# Patient Record
Sex: Male | Born: 1965 | Hispanic: No | Marital: Married | Smoking: Never smoker
Health system: Southern US, Community
[De-identification: ages and names within clinical notes are randomized; demographics above are authoritative.]

## PROBLEM LIST (undated history)

## (undated) ENCOUNTER — Emergency Department (HOSPITAL_COMMUNITY): Admission: EM | Payer: No Typology Code available for payment source | Source: Home / Self Care

---

## 1998-10-27 ENCOUNTER — Emergency Department (HOSPITAL_COMMUNITY): Admission: EM | Admit: 1998-10-27 | Discharge: 1998-10-27 | Payer: Self-pay | Admitting: Emergency Medicine

## 2000-08-19 ENCOUNTER — Emergency Department (HOSPITAL_COMMUNITY): Admission: EM | Admit: 2000-08-19 | Discharge: 2000-08-19 | Payer: Self-pay | Admitting: Emergency Medicine

## 2000-08-20 ENCOUNTER — Inpatient Hospital Stay (HOSPITAL_COMMUNITY): Admission: EM | Admit: 2000-08-20 | Discharge: 2000-08-24 | Payer: Self-pay | Admitting: *Deleted

## 2000-08-20 ENCOUNTER — Encounter: Payer: Self-pay | Admitting: Internal Medicine

## 2000-08-25 ENCOUNTER — Encounter: Admission: RE | Admit: 2000-08-25 | Discharge: 2000-11-23 | Payer: Self-pay | Admitting: Internal Medicine

## 2002-10-04 ENCOUNTER — Emergency Department (HOSPITAL_COMMUNITY): Admission: EM | Admit: 2002-10-04 | Discharge: 2002-10-05 | Payer: Self-pay | Admitting: Emergency Medicine

## 2002-10-05 ENCOUNTER — Encounter: Payer: Self-pay | Admitting: Emergency Medicine

## 2002-10-15 ENCOUNTER — Emergency Department (HOSPITAL_COMMUNITY): Admission: EM | Admit: 2002-10-15 | Discharge: 2002-10-15 | Payer: Self-pay | Admitting: Emergency Medicine

## 2002-11-10 ENCOUNTER — Encounter: Payer: Self-pay | Admitting: Family Medicine

## 2002-11-10 ENCOUNTER — Ambulatory Visit (HOSPITAL_COMMUNITY): Admission: RE | Admit: 2002-11-10 | Discharge: 2002-11-10 | Payer: Self-pay | Admitting: Family Medicine

## 2002-11-13 ENCOUNTER — Encounter: Payer: Self-pay | Admitting: Family Medicine

## 2002-11-13 ENCOUNTER — Ambulatory Visit (HOSPITAL_COMMUNITY): Admission: RE | Admit: 2002-11-13 | Discharge: 2002-11-13 | Payer: Self-pay | Admitting: Family Medicine

## 2002-11-16 ENCOUNTER — Emergency Department (HOSPITAL_COMMUNITY): Admission: EM | Admit: 2002-11-16 | Discharge: 2002-11-17 | Payer: Self-pay | Admitting: Emergency Medicine

## 2002-11-16 ENCOUNTER — Encounter: Payer: Self-pay | Admitting: Emergency Medicine

## 2003-10-22 ENCOUNTER — Encounter: Admission: RE | Admit: 2003-10-22 | Discharge: 2003-10-22 | Payer: Self-pay | Admitting: Sports Medicine

## 2003-10-27 ENCOUNTER — Ambulatory Visit (HOSPITAL_COMMUNITY): Admission: RE | Admit: 2003-10-27 | Discharge: 2003-10-27 | Payer: Self-pay | Admitting: Sports Medicine

## 2004-01-02 ENCOUNTER — Emergency Department (HOSPITAL_COMMUNITY): Admission: EM | Admit: 2004-01-02 | Discharge: 2004-01-02 | Payer: Self-pay | Admitting: *Deleted

## 2004-01-12 ENCOUNTER — Emergency Department (HOSPITAL_COMMUNITY): Admission: EM | Admit: 2004-01-12 | Discharge: 2004-01-12 | Payer: Self-pay | Admitting: Emergency Medicine

## 2004-02-04 ENCOUNTER — Ambulatory Visit (HOSPITAL_COMMUNITY): Admission: RE | Admit: 2004-02-04 | Discharge: 2004-02-04 | Payer: Self-pay | Admitting: Chiropractic Medicine

## 2004-09-27 ENCOUNTER — Emergency Department (HOSPITAL_COMMUNITY): Admission: EM | Admit: 2004-09-27 | Discharge: 2004-09-27 | Payer: Self-pay | Admitting: Emergency Medicine

## 2004-10-19 ENCOUNTER — Encounter: Admission: RE | Admit: 2004-10-19 | Discharge: 2004-10-19 | Payer: Self-pay | Admitting: Interventional Cardiology

## 2004-10-27 ENCOUNTER — Ambulatory Visit: Payer: Self-pay | Admitting: *Deleted

## 2004-12-01 ENCOUNTER — Ambulatory Visit: Payer: Self-pay | Admitting: Family Medicine

## 2007-06-20 ENCOUNTER — Ambulatory Visit (HOSPITAL_COMMUNITY): Admission: RE | Admit: 2007-06-20 | Discharge: 2007-06-20 | Payer: Self-pay | Admitting: Oncology

## 2007-07-15 ENCOUNTER — Ambulatory Visit (HOSPITAL_COMMUNITY): Admission: RE | Admit: 2007-07-15 | Discharge: 2007-07-15 | Payer: Self-pay | Admitting: Chiropractic Medicine

## 2007-08-14 ENCOUNTER — Emergency Department (HOSPITAL_COMMUNITY): Admission: EM | Admit: 2007-08-14 | Discharge: 2007-08-14 | Payer: Self-pay | Admitting: Emergency Medicine

## 2007-08-19 ENCOUNTER — Emergency Department (HOSPITAL_COMMUNITY): Admission: EM | Admit: 2007-08-19 | Discharge: 2007-08-19 | Payer: Self-pay | Admitting: Emergency Medicine

## 2007-08-23 ENCOUNTER — Emergency Department (HOSPITAL_COMMUNITY): Admission: EM | Admit: 2007-08-23 | Discharge: 2007-08-23 | Payer: Self-pay | Admitting: Emergency Medicine

## 2007-10-21 ENCOUNTER — Emergency Department (HOSPITAL_COMMUNITY): Admission: EM | Admit: 2007-10-21 | Discharge: 2007-10-21 | Payer: Self-pay | Admitting: Emergency Medicine

## 2007-12-24 ENCOUNTER — Emergency Department (HOSPITAL_COMMUNITY): Admission: EM | Admit: 2007-12-24 | Discharge: 2007-12-24 | Payer: Self-pay | Admitting: Emergency Medicine

## 2008-01-20 ENCOUNTER — Emergency Department (HOSPITAL_COMMUNITY): Admission: EM | Admit: 2008-01-20 | Discharge: 2008-01-20 | Payer: Self-pay | Admitting: Emergency Medicine

## 2008-10-26 ENCOUNTER — Emergency Department (HOSPITAL_COMMUNITY): Admission: EM | Admit: 2008-10-26 | Discharge: 2008-10-26 | Payer: Self-pay | Admitting: Emergency Medicine

## 2008-10-27 ENCOUNTER — Emergency Department (HOSPITAL_COMMUNITY): Admission: EM | Admit: 2008-10-27 | Discharge: 2008-10-27 | Payer: Self-pay | Admitting: Emergency Medicine

## 2008-12-14 ENCOUNTER — Emergency Department (HOSPITAL_COMMUNITY): Admission: EM | Admit: 2008-12-14 | Discharge: 2008-12-14 | Payer: Self-pay | Admitting: Emergency Medicine

## 2008-12-17 ENCOUNTER — Emergency Department (HOSPITAL_COMMUNITY): Admission: EM | Admit: 2008-12-17 | Discharge: 2008-12-17 | Payer: Self-pay | Admitting: Emergency Medicine

## 2008-12-20 ENCOUNTER — Emergency Department (HOSPITAL_COMMUNITY): Admission: EM | Admit: 2008-12-20 | Discharge: 2008-12-20 | Payer: Self-pay | Admitting: Family Medicine

## 2008-12-21 ENCOUNTER — Emergency Department (HOSPITAL_COMMUNITY): Admission: EM | Admit: 2008-12-21 | Discharge: 2008-12-22 | Payer: Self-pay | Admitting: Emergency Medicine

## 2009-01-17 ENCOUNTER — Emergency Department (HOSPITAL_COMMUNITY): Admission: EM | Admit: 2009-01-17 | Discharge: 2009-01-17 | Payer: Self-pay | Admitting: Emergency Medicine

## 2009-01-18 ENCOUNTER — Emergency Department (HOSPITAL_COMMUNITY): Admission: EM | Admit: 2009-01-18 | Discharge: 2009-01-18 | Payer: Self-pay | Admitting: Emergency Medicine

## 2009-02-10 ENCOUNTER — Emergency Department (HOSPITAL_COMMUNITY): Admission: EM | Admit: 2009-02-10 | Discharge: 2009-02-10 | Payer: Self-pay | Admitting: Family Medicine

## 2009-04-10 ENCOUNTER — Emergency Department (HOSPITAL_COMMUNITY): Admission: EM | Admit: 2009-04-10 | Discharge: 2009-04-11 | Payer: Self-pay | Admitting: Emergency Medicine

## 2009-04-23 ENCOUNTER — Emergency Department (HOSPITAL_COMMUNITY): Admission: EM | Admit: 2009-04-23 | Discharge: 2009-04-23 | Payer: Self-pay | Admitting: Emergency Medicine

## 2009-06-26 ENCOUNTER — Emergency Department (HOSPITAL_COMMUNITY): Admission: EM | Admit: 2009-06-26 | Discharge: 2009-06-26 | Payer: Self-pay | Admitting: Internal Medicine

## 2009-08-09 ENCOUNTER — Emergency Department (HOSPITAL_COMMUNITY): Admission: EM | Admit: 2009-08-09 | Discharge: 2009-08-09 | Payer: Self-pay | Admitting: Emergency Medicine

## 2009-09-03 ENCOUNTER — Emergency Department (HOSPITAL_COMMUNITY): Admission: EM | Admit: 2009-09-03 | Discharge: 2009-09-04 | Payer: Self-pay | Admitting: Emergency Medicine

## 2009-09-04 ENCOUNTER — Emergency Department (HOSPITAL_COMMUNITY): Admission: EM | Admit: 2009-09-04 | Discharge: 2009-09-04 | Payer: Self-pay | Admitting: Emergency Medicine

## 2009-09-05 ENCOUNTER — Emergency Department (HOSPITAL_COMMUNITY): Admission: EM | Admit: 2009-09-05 | Discharge: 2009-09-05 | Payer: Self-pay | Admitting: Emergency Medicine

## 2009-10-12 ENCOUNTER — Emergency Department (HOSPITAL_COMMUNITY): Admission: EM | Admit: 2009-10-12 | Discharge: 2009-10-12 | Payer: Self-pay | Admitting: Emergency Medicine

## 2009-11-19 ENCOUNTER — Emergency Department (HOSPITAL_COMMUNITY): Admission: EM | Admit: 2009-11-19 | Discharge: 2009-11-19 | Payer: Self-pay | Admitting: Emergency Medicine

## 2010-03-13 ENCOUNTER — Emergency Department (HOSPITAL_COMMUNITY): Admission: EM | Admit: 2010-03-13 | Discharge: 2010-03-13 | Payer: Self-pay | Admitting: Emergency Medicine

## 2010-03-18 ENCOUNTER — Emergency Department (HOSPITAL_COMMUNITY): Admission: EM | Admit: 2010-03-18 | Discharge: 2010-03-18 | Payer: Self-pay | Admitting: Emergency Medicine

## 2010-04-15 ENCOUNTER — Emergency Department (HOSPITAL_COMMUNITY)
Admission: EM | Admit: 2010-04-15 | Discharge: 2010-04-15 | Payer: Self-pay | Source: Home / Self Care | Admitting: Emergency Medicine

## 2010-04-16 ENCOUNTER — Emergency Department (HOSPITAL_COMMUNITY)
Admission: EM | Admit: 2010-04-16 | Discharge: 2010-04-16 | Payer: Self-pay | Source: Home / Self Care | Admitting: Emergency Medicine

## 2010-04-19 ENCOUNTER — Emergency Department (HOSPITAL_COMMUNITY)
Admission: EM | Admit: 2010-04-19 | Discharge: 2010-04-19 | Payer: Self-pay | Source: Home / Self Care | Admitting: Emergency Medicine

## 2010-04-20 ENCOUNTER — Emergency Department (HOSPITAL_COMMUNITY)
Admission: EM | Admit: 2010-04-20 | Discharge: 2010-04-20 | Payer: Self-pay | Source: Home / Self Care | Admitting: Emergency Medicine

## 2010-05-17 ENCOUNTER — Emergency Department (HOSPITAL_COMMUNITY)
Admission: EM | Admit: 2010-05-17 | Discharge: 2010-05-17 | Payer: Self-pay | Source: Home / Self Care | Admitting: Emergency Medicine

## 2010-05-18 ENCOUNTER — Emergency Department (HOSPITAL_COMMUNITY)
Admission: EM | Admit: 2010-05-18 | Discharge: 2010-05-18 | Payer: Self-pay | Source: Home / Self Care | Admitting: Emergency Medicine

## 2010-05-20 ENCOUNTER — Emergency Department (HOSPITAL_COMMUNITY)
Admission: EM | Admit: 2010-05-20 | Discharge: 2010-05-20 | Payer: Self-pay | Source: Home / Self Care | Admitting: Emergency Medicine

## 2010-05-25 LAB — URINALYSIS, ROUTINE W REFLEX MICROSCOPIC
Bilirubin Urine: NEGATIVE
Hgb urine dipstick: NEGATIVE
Ketones, ur: NEGATIVE mg/dL
Nitrite: NEGATIVE
Protein, ur: NEGATIVE mg/dL
Specific Gravity, Urine: 1.019 (ref 1.005–1.030)
Urine Glucose, Fasting: NEGATIVE mg/dL
Urobilinogen, UA: 0.2 mg/dL (ref 0.0–1.0)
pH: 5.5 (ref 5.0–8.0)

## 2010-07-20 LAB — DIFFERENTIAL
Basophils Absolute: 0 10*3/uL (ref 0.0–0.1)
Basophils Relative: 0 % (ref 0–1)
Lymphocytes Relative: 15 % (ref 12–46)
Neutro Abs: 6.6 10*3/uL (ref 1.7–7.7)
Neutrophils Relative %: 76 % (ref 43–77)

## 2010-07-20 LAB — CBC
HCT: 40.1 % (ref 39.0–52.0)
MCH: 29.6 pg (ref 26.0–34.0)
MCV: 87.2 fL (ref 78.0–100.0)
RDW: 11.3 % — ABNORMAL LOW (ref 11.5–15.5)
WBC: 8.7 10*3/uL (ref 4.0–10.5)

## 2010-07-20 LAB — COMPREHENSIVE METABOLIC PANEL
Alkaline Phosphatase: 136 U/L — ABNORMAL HIGH (ref 39–117)
BUN: 13 mg/dL (ref 6–23)
Chloride: 97 mEq/L (ref 96–112)
Creatinine, Ser: 0.81 mg/dL (ref 0.4–1.5)
Glucose, Bld: 111 mg/dL — ABNORMAL HIGH (ref 70–99)
Potassium: 4.4 mEq/L (ref 3.5–5.1)
Total Bilirubin: 0.9 mg/dL (ref 0.3–1.2)

## 2010-07-20 LAB — PROTEIN, CSF: Total  Protein, CSF: 20 mg/dL (ref 15–45)

## 2010-07-20 LAB — CSF CELL COUNT WITH DIFFERENTIAL: Tube #: 3

## 2010-07-20 LAB — POCT CARDIAC MARKERS
CKMB, poc: <1 ng/mL — ABNORMAL LOW (ref 1.0–8.0)
Troponin i, poc: <0.05 ng/mL (ref 0.00–0.09)

## 2010-07-20 LAB — CSF CULTURE W GRAM STAIN

## 2010-07-20 LAB — GLUCOSE, CSF: Glucose, CSF: 70 mg/dL (ref 43–76)

## 2010-08-17 LAB — POCT I-STAT, CHEM 8
Calcium, Ion: 1.18 mmol/L (ref 1.12–1.32)
Chloride: 101 mEq/L (ref 96–112)
HCT: 43 % (ref 39.0–52.0)
Potassium: 4 mEq/L (ref 3.5–5.1)
Sodium: 137 mEq/L (ref 135–145)

## 2010-08-17 LAB — URINALYSIS, ROUTINE W REFLEX MICROSCOPIC
Bilirubin Urine: NEGATIVE
Hgb urine dipstick: NEGATIVE
Protein, ur: NEGATIVE mg/dL
Urobilinogen, UA: 1 mg/dL (ref 0.0–1.0)

## 2010-08-17 LAB — COMPREHENSIVE METABOLIC PANEL
AST: 22 U/L (ref 0–37)
Albumin: 4 g/dL (ref 3.5–5.2)
Alkaline Phosphatase: 59 U/L (ref 39–117)
BUN: 20 mg/dL (ref 6–23)
Chloride: 100 mEq/L (ref 96–112)
Potassium: 3.9 mEq/L (ref 3.5–5.1)
Total Bilirubin: 0.6 mg/dL (ref 0.3–1.2)

## 2010-08-17 LAB — DIFFERENTIAL
Basophils Absolute: 0 10*3/uL (ref 0.0–0.1)
Basophils Relative: 1 % (ref 0–1)
Eosinophils Relative: 11 % — ABNORMAL HIGH (ref 0–5)
Monocytes Absolute: 0.5 10*3/uL (ref 0.1–1.0)

## 2010-08-17 LAB — CBC
HCT: 43 % (ref 39.0–52.0)
Platelets: 227 10*3/uL (ref 150–400)
RBC: 4.76 MIL/uL (ref 4.22–5.81)
WBC: 6 10*3/uL (ref 4.0–10.5)

## 2010-09-25 NOTE — Discharge Summary (Signed)
Jeff Perry  Patient:    Jeff Perry, Jeff Perry                        MRN: [id number] Adm. Date:  [id number] Disc. Date: [id number] Attending:  [name] CC:         Nutrition and Diabetes Management  Perry   Discharge Summary  DISCHARGE DIAGNOSES: 1. Diabetic ketoacidosis. 2. Type 1 diabetes mellitus, newly diagnosed and uncontrolled. 3. Hypokalemia. 4. Mild sinus tachycardia. 5. Subclinical hyperthyroidism. 6. Seasonal allergic rhinitis.  DISCHARGE MEDICATIONS: 1. Insulin 70/30 38 units q.a.c. in the morning and prior to dinner time. 2. Potassium chloride 40 mEq b.i.d. 3. Claritin 10 mg each day as needed.  Patient has maintained a no-concentrated-sweets diet.  He is instructed to bring an English-speaking family member to visits with the Nutrition and Diabetes Management Perry which is yet to be set up by [facility] and with myself, Dr. [name].  He is to call [name], my nurse, at [phone] for an appointment during the last week of April or early May.  At that time, he will need a TSH, free T4, EKG, CMET, urinalysis, and microalbumin.  HISTORY OF PRESENT ILLNESS:  This is a 45 year old [place] male who has poor English-speaking capabilities and has been a resident of the [place] for approximately four years.  He is employed in a Systems analyst.  Family states that he has classic symptoms of diabetes mellitus with progressive polyuria and polydipsia with a one week history of increasing nausea, vomiting, and lethargy.  In the emergency room, he was found to be in DKA with a pH of 7.1 and a bicarbonate of 2.9 associated with a serum glucose well in excess of 350.  Limited information obtained from the family indicated that the patient is unaware of any history of diabetes and there is no history of diabetes in the family.  Patient was admitted for management of his DKA and education for type 1 diabetes and insulin administration  techniques.  HOSPITAL COURSE:  Patient was admitted to the intensive care unit where he was placed on Glucomander for diabetic control.  He was maintained on significant amounts of intravenous fluids consisting initially of normal saline, then switched over to D5 normal saline to also correct not only his DKA in the presence of insulin as well as his hyponatremia.  He subsequently developed hypokalemia which required significant amounts of potassium supplementation. Prior to discharge, his potassium levels were normal.  Upon presentation, the patient was extremely lethargic but arousible and could follow commands. Prior to discharge, he was ambulating around his room without distress, yet language barriers prevented adequate review of systems.  He was seen by the diabetes educator and the dietician who attempted to go over issues with insulin administration, drawing up insulin into a syringe, self-injection techniques, meal planning.  Patient and brother who has limited English capabilities, appeared to understand and patient was able to demonstrate drawing up insulin to the appropriate dose and self-inject prior to discharge.  Patient appeared to learn quickly, however, was teary-eyed at times given this new diagnosis of diabetes mellitus.  He was also able to check his blood glucose.  However, his comprehension was difficult to ascertain.  He was very cooperative.  Outpatient appointment with the Nutrition and Diabetes Management Perry is pending.  Patient did complain of significant upper respiratory congestion, for which he was placed on Claritin and mucolytic agents with significant and prompt relief.  Patient did have some mild tachycardia, however was hemodynamically stable and was not complaining of palpitations.  In the emergency room, patient was in a sinus rhythm but tachycardic.  It is unclear whether this is secondary to mild anxiety.  However, his heart rate ranged  anywhere from 101 to 127.  Patient did have a slightly low TSH as documented below.  However, free T4 measurements were normal.  This will have to be confirmed on an outpatient basis and may indeed be contributing to his mild tachycardia.  Clinically, the patient did not appear thyroid toxic.  DATA:  Discharge hospital labs were sodium 140, potassium 4.6, chloride 106, serum bicarbonate 29, BUN 9, creatinine 0.6, glucose 184, calcium 9.1.  C peptide is 2.6.  CBC:  White blood cell count 4.7, hemoglobin 13.1, hematocrit 38%, platelet count 228.  Of note, potassium level was as low as 2.5. Admission ABG revealed a pH of 7.106, PCO2 of 9.3, PO2 of 130, bicarbonate of 2.9, oxygen saturation 98% on room air.  TSH was 0.234, free T4 was 1.08. ALT was 27, alkaline phosphatase 121, total bilirubin 2.3, magnesium 2.0, phosphorus 2.5, serum acetone moderate.  Patient also had a toxicology screen which was negative.  Urinalysis was unremarkable for infection.  Chest x-ray revealed no acute cardiopulmonary disease.  Of note, patient may need further assessment for his insulin-dependent needs given his elevated C peptide. DD:  08/24/00 TD:  08/24/00 Job: [id number]
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
## 2010-10-27 ENCOUNTER — Emergency Department (HOSPITAL_COMMUNITY)
Admission: EM | Admit: 2010-10-27 | Discharge: 2010-10-27 | Disposition: A | Discharge status: Home / Self Care | Payer: Self-pay | Attending: Emergency Medicine | Admitting: Emergency Medicine

## 2010-10-27 DIAGNOSIS — H5710 Ocular pain, unspecified eye: Secondary | ICD-10-CM | POA: Insufficient documentation

## 2010-10-27 DIAGNOSIS — H5789 Other specified disorders of eye and adnexa: Secondary | ICD-10-CM | POA: Insufficient documentation

## 2010-10-27 DIAGNOSIS — H16139 Photokeratitis, unspecified eye: Secondary | ICD-10-CM | POA: Insufficient documentation

## 2011-03-13 ENCOUNTER — Emergency Department (HOSPITAL_COMMUNITY): Payer: No Typology Code available for payment source

## 2011-03-13 ENCOUNTER — Emergency Department (HOSPITAL_COMMUNITY)
Admission: EM | Admit: 2011-03-13 | Discharge: 2011-03-13 | Disposition: A | Discharge status: Home / Self Care | Payer: No Typology Code available for payment source | Attending: Emergency Medicine | Admitting: Emergency Medicine

## 2011-03-13 DIAGNOSIS — M545 Low back pain, unspecified: Secondary | ICD-10-CM | POA: Insufficient documentation

## 2011-06-03 ENCOUNTER — Emergency Department (HOSPITAL_COMMUNITY)
Admission: EM | Admit: 2011-06-03 | Discharge: 2011-06-03 | Disposition: A | Discharge status: Home / Self Care | Payer: Self-pay | Attending: Emergency Medicine | Admitting: Emergency Medicine

## 2011-06-03 ENCOUNTER — Encounter (HOSPITAL_COMMUNITY): Payer: Self-pay | Admitting: *Deleted

## 2011-06-03 ENCOUNTER — Emergency Department (HOSPITAL_COMMUNITY): Payer: Self-pay

## 2011-06-03 DIAGNOSIS — S0101XA Laceration without foreign body of scalp, initial encounter: Secondary | ICD-10-CM

## 2011-06-03 DIAGNOSIS — W208XXA Other cause of strike by thrown, projected or falling object, initial encounter: Secondary | ICD-10-CM | POA: Insufficient documentation

## 2011-06-03 DIAGNOSIS — S0990XA Unspecified injury of head, initial encounter: Secondary | ICD-10-CM | POA: Insufficient documentation

## 2011-06-03 DIAGNOSIS — S0100XA Unspecified open wound of scalp, initial encounter: Secondary | ICD-10-CM | POA: Insufficient documentation

## 2011-06-03 DIAGNOSIS — R51 Headache: Secondary | ICD-10-CM | POA: Insufficient documentation

## 2011-06-03 NOTE — ED Notes (Signed)
Patient returned from CT
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
## 2011-06-03 NOTE — ED Notes (Signed)
Patient asked when his papers will be ready.  Explained to patient that the doctor has to write the instructions and we are waiting on it, she is busy at this time.

## 2011-06-03 NOTE — ED Notes (Signed)
Cleaned laceration on head with hydrogen peroxide and normal saline and sterile 4x4s. Pt tolerated well. Suture cart and staple gun at bedside.

## 2011-06-03 NOTE — ED Notes (Addendum)
Patient state he was working on his car and the hood fell down on time of him head. Patient does appear to have a small lac on the top right side of his head. Unable to see through patients hair. Bleeding is controlled at this time and patient is resting with NAD at this time.
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
## 2011-06-03 NOTE — ED Notes (Signed)
Patient waiting to have lac stapled. Patient resting with NAD at this time.

## 2011-06-20 ENCOUNTER — Emergency Department (HOSPITAL_COMMUNITY)
Admission: EM | Admit: 2011-06-20 | Discharge: 2011-06-20 | Disposition: A | Discharge status: Home / Self Care | Payer: No Typology Code available for payment source | Attending: Emergency Medicine | Admitting: Emergency Medicine

## 2011-06-20 ENCOUNTER — Encounter (HOSPITAL_COMMUNITY): Payer: Self-pay | Admitting: Emergency Medicine

## 2011-06-20 DIAGNOSIS — Z4802 Encounter for removal of sutures: Secondary | ICD-10-CM | POA: Insufficient documentation

## 2011-06-20 NOTE — ED Provider Notes (Signed)
Medical screening examination/treatment/procedure(s) were performed by non-physician practitioner and as supervising physician I was immediately available for consultation/collaboration.  Zaelynn Fuchs, MD 06/20/11 2130 

## 2011-06-20 NOTE — ED Provider Notes (Signed)
History     CSN: [id number]  Arrival date & time 06/20/11  4098   First MD Initiated Contact with Patient 06/20/11 1948      Chief Complaint  Patient presents with  . Suture / Staple Removal    (Consider location/radiation/quality/duration/timing/severity/associated sxs/prior treatment) Patient is a 46 y.o. male presenting with suture removal. The history is provided by the patient.  Suture / Staple Removal  The sutures were placed 11 to 14 days ago. There has been no treatment since the wound repair. There has been no drainage from the wound. There is no redness present. There is no swelling present. The pain has no pain.    History reviewed. No pertinent past medical history.  History reviewed. No pertinent past surgical history.  No family history on file.  History  Substance Use Topics  . Smoking status: Never Smoker   . Smokeless tobacco: Not on file  . Alcohol Use: No      Review of Systems  Constitutional: Negative for fever and chills.  HENT: Negative.   Respiratory: Negative.   Cardiovascular: Negative.   Gastrointestinal: Negative.   Musculoskeletal: Negative.   Skin: Negative.   Neurological: Negative.     Allergies  Review of patient's allergies indicates no known allergies.  Home Medications  No current outpatient prescriptions on file.  BP 114/68  Pulse 69  Temp(Src) 98 F (36.7 C) (Oral)  Resp 20  SpO2 100%  Physical Exam  Constitutional: He is oriented to person, place, and time. He appears well-developed and well-nourished.  Neck: Normal range of motion.  Pulmonary/Chest: Effort normal.  Musculoskeletal: Normal range of motion.  Neurological: He is alert and oriented to person, place, and time.  Skin: Skin is warm and dry.       Well healed laceration right parietal scalp with 2 intact staples.  Psychiatric: He has a normal mood and affect.    ED Course  Procedures (including critical care time)  Labs Reviewed - No data to  display No results found.   No diagnosis found.    MDM          Rodena Medin, PA-C 06/20/11 2043

## 2011-06-20 NOTE — ED Notes (Signed)
Pt here to have staples removed from R side of head.  States they have been there about 10 days.

## 2011-09-08 ENCOUNTER — Emergency Department (HOSPITAL_COMMUNITY)
Admission: EM | Admit: 2011-09-08 | Discharge: 2011-09-08 | Disposition: A | Discharge status: Home / Self Care | Payer: No Typology Code available for payment source | Attending: Emergency Medicine | Admitting: Emergency Medicine

## 2011-09-08 ENCOUNTER — Encounter (HOSPITAL_COMMUNITY): Payer: Self-pay | Admitting: *Deleted

## 2011-09-08 DIAGNOSIS — S139XXA Sprain of joints and ligaments of unspecified parts of neck, initial encounter: Secondary | ICD-10-CM | POA: Insufficient documentation

## 2011-09-08 DIAGNOSIS — S134XXA Sprain of ligaments of cervical spine, initial encounter: Secondary | ICD-10-CM

## 2011-09-08 DIAGNOSIS — Y93I9 Activity, other involving external motion: Secondary | ICD-10-CM | POA: Insufficient documentation

## 2011-09-08 DIAGNOSIS — Y998 Other external cause status: Secondary | ICD-10-CM | POA: Insufficient documentation

## 2011-09-08 MED ORDER — OXYCODONE-ACETAMINOPHEN 5-325 MG PO TABS: 2.0000 | ORAL_TABLET | ORAL | Status: AC | PRN

## 2011-09-08 MED ORDER — CYCLOBENZAPRINE HCL 5 MG PO TABS: 5.0000 mg | ORAL_TABLET | Freq: Three times a day (TID) | ORAL | Status: AC | PRN

## 2011-09-08 MED ORDER — IBUPROFEN 800 MG PO TABS
800.0000 mg | ORAL_TABLET | Freq: Once | ORAL | Status: AC
  Administered 2011-09-08: 800 mg via ORAL
  Filled 2011-09-08: qty 1

## 2011-09-08 MED ORDER — IBUPROFEN 800 MG PO TABS: 800.0000 mg | ORAL_TABLET | Freq: Three times a day (TID) | ORAL | Status: AC
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
## 2011-09-08 NOTE — ED Provider Notes (Signed)
Medical screening examination/treatment/procedure(s) were performed by non-physician practitioner and as supervising physician I was immediately available for consultation/collaboration.  Mael Delap T Bernie Ransford, MD 09/08/11 2325 
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
## 2011-11-06 ENCOUNTER — Encounter (HOSPITAL_BASED_OUTPATIENT_CLINIC_OR_DEPARTMENT_OTHER): Payer: Self-pay | Admitting: *Deleted

## 2011-11-06 ENCOUNTER — Emergency Department (HOSPITAL_BASED_OUTPATIENT_CLINIC_OR_DEPARTMENT_OTHER)
Admission: EM | Admit: 2011-11-06 | Discharge: 2011-11-07 | Disposition: A | Discharge status: Home / Self Care | Payer: Self-pay | Attending: Emergency Medicine | Admitting: Emergency Medicine

## 2011-11-06 ENCOUNTER — Emergency Department (HOSPITAL_BASED_OUTPATIENT_CLINIC_OR_DEPARTMENT_OTHER): Payer: Self-pay

## 2011-11-06 DIAGNOSIS — M109 Gout, unspecified: Secondary | ICD-10-CM | POA: Insufficient documentation

## 2011-11-06 MED ORDER — OXYCODONE-ACETAMINOPHEN 5-325 MG PO TABS
1.0000 | ORAL_TABLET | Freq: Once | ORAL | Status: AC
  Administered 2011-11-06: 1 via ORAL
  Filled 2011-11-06: qty 1

## 2011-11-06 MED ORDER — INDOMETHACIN 50 MG PO CAPS: 50.0000 mg | ORAL_CAPSULE | Freq: Three times a day (TID) | ORAL | Status: AC

## 2011-11-06 MED ORDER — KETOROLAC TROMETHAMINE 60 MG/2ML IM SOLN
60.0000 mg | Freq: Once | INTRAMUSCULAR | Status: AC
  Administered 2011-11-06: 60 mg via INTRAMUSCULAR
  Filled 2011-11-06: qty 2

## 2011-11-06 MED ORDER — OXYCODONE-ACETAMINOPHEN 5-325 MG PO TABS: 1.0000 | ORAL_TABLET | ORAL | Status: AC | PRN

## 2011-11-06 NOTE — Discharge Instructions (Signed)
Gout Gout is an inflammatory condition (arthritis) caused by a buildup of uric acid crystals in the joints. Uric acid is a chemical that is normally present in the blood. Under some circumstances, uric acid can form into crystals in your joints. This causes joint redness, soreness, and swelling (inflammation). Repeat attacks are common. Over time, uric acid crystals can form into masses (tophi) near a joint, causing disfigurement. Gout is treatable and often preventable. CAUSES  The disease begins with elevated levels of uric acid in the blood. Uric acid is produced by your body when it breaks down a naturally found substance called purines. This also happens when you eat certain foods such as meats and fish. Causes of an elevated uric acid level include:  Being passed down from parent to child (heredity).   Diseases that cause increased uric acid production (obesity, psoriasis, some cancers).   Excessive alcohol use.   Diet, especially diets rich in meat and seafood.   Medicines, including certain cancer-fighting drugs (chemotherapy), diuretics, and aspirin.   Chronic kidney disease. The kidneys are no longer able to remove uric acid well.   Problems with metabolism.  Conditions strongly associated with gout include:  Obesity.   High blood pressure.   High cholesterol.   Diabetes.  Not everyone with elevated uric acid levels gets gout. It is not understood why some people get gout and others do not. Surgery, joint injury, and eating too much of certain foods are some of the factors that can lead to gout. SYMPTOMS   An attack of gout comes on quickly. It causes intense pain with redness, swelling, and warmth in a joint.   Fever can occur.   Often, only one joint is involved. Certain joints are more commonly involved:   Base of the big toe.   Knee.   Ankle.   Wrist.   Finger.  Without treatment, an attack usually goes away in a few days to weeks. Between attacks, you  usually will not have symptoms, which is different from many other forms of arthritis. DIAGNOSIS  Your caregiver will suspect gout based on your symptoms and exam. Removal of fluid from the joint (arthrocentesis) is done to check for uric acid crystals. Your caregiver will give you a medicine that numbs the area (local anesthetic) and use a needle to remove joint fluid for exam. Gout is confirmed when uric acid crystals are seen in joint fluid, using a special microscope. Sometimes, blood, urine, and X-ray tests are also used. TREATMENT  There are 2 phases to gout treatment: treating the sudden onset (acute) attack and preventing attacks (prophylaxis). Treatment of an Acute Attack  Medicines are used. These include anti-inflammatory medicines or steroid medicines.   An injection of steroid medicine into the affected joint is sometimes necessary.   The painful joint is rested. Movement can worsen the arthritis.   You may use warm or cold treatments on painful joints, depending which works best for you.   Discuss the use of coffee, vitamin C, or cherries with your caregiver. These may be helpful treatment options.  Treatment to Prevent Attacks After the acute attack subsides, your caregiver may advise prophylactic medicine. These medicines either help your kidneys eliminate uric acid from your body or decrease your uric acid production. You may need to stay on these medicines for a very long time. The early phase of treatment with prophylactic medicine can be associated with an increase in acute gout attacks. For this reason, during the first few months   of treatment, your caregiver may also advise you to take medicines usually used for acute gout treatment. Be sure you understand your caregiver's directions. You should also discuss dietary treatment with your caregiver. Certain foods such as meats and fish can increase uric acid levels. Other foods such as dairy can decrease levels. Your caregiver  can give you a list of foods to avoid. HOME CARE INSTRUCTIONS   Do not take aspirin to relieve pain. This raises uric acid levels.   Only take over-the-counter or prescription medicines for pain, discomfort, or fever as directed by your caregiver.   Rest the joint as much as possible. When in bed, keep sheets and blankets off painful areas.   Keep the affected joint raised (elevated).   Use crutches if the painful joint is in your leg.   Drink enough water and fluids to keep your urine clear or pale yellow. This helps your body get rid of uric acid. Do not drink alcoholic beverages. They slow the passage of uric acid.   Follow your caregiver's dietary instructions. Pay careful attention to the amount of protein you eat. Your daily diet should emphasize fruits, vegetables, whole grains, and fat-free or low-fat milk products.   Maintain a healthy body weight.  SEEK MEDICAL CARE IF:   You have an oral temperature above 102 F (38.9 C).   You develop diarrhea, vomiting, or any side effects from medicines.   You do not feel better in 24 hours, or you are getting worse.  SEEK IMMEDIATE MEDICAL CARE IF:   Your joint becomes suddenly more tender and you have:   Chills.   An oral temperature above 102 F (38.9 C), not controlled by medicine.  MAKE SURE YOU:   Understand these instructions.   Will watch your condition.   Will get help right away if you are not doing well or get worse.  Document Released: 04/23/2000 Document Revised: 04/15/2011 Document Reviewed: 08/04/2009 [employer] Patient Information 2012 [employer]. 

## 2011-11-06 NOTE — ED Notes (Signed)
Pt describes left great toe pain since yesterday. No known injury.

## 2011-11-06 NOTE — ED Provider Notes (Signed)
History   This chart was scribed for Loren Racer, MD scribed by Magnus Sinning. The patient was seen in room MH03/MH03 seen at 23:28   CSN: [id number]  Arrival date & time 11/06/11  2215   First MD Initiated Contact with Patient 11/06/11 2326      Chief Complaint  Patient presents with  . Toe Pain    (Consider location/radiation/quality/duration/timing/severity/associated sxs/prior treatment) HPI Jeff Perry is a 46 y.o. male who presents to the Emergency Department complaining of constant moderate left great toe pain with associated foot pain onset yesterday. Relative states that he is unable to move his left big toe due to pain. Reports that patient was given an injection in the heel at the doctors office , but that it has gradually worsening since. He says that he's taken one 800 mg ibuprofen at home with no relief. History reviewed. No pertinent past medical history.  History reviewed. No pertinent past surgical history.  History reviewed. No pertinent family history.  History  Substance Use Topics  . Smoking status: Never Smoker   . Smokeless tobacco: Not on file  . Alcohol Use: No      Review of Systems 10 Systems reviewed and are negative for acute change except as noted in the HPI. Allergies  Review of patient's allergies indicates no known allergies.  Home Medications   Current Outpatient Rx  Name Route Sig Dispense Refill  . INDOMETHACIN 50 MG PO CAPS Oral Take 1 capsule (50 mg total) by mouth 3 (three) times daily with meals. 60 capsule 0  . OXYCODONE-ACETAMINOPHEN 5-325 MG PO TABS Oral Take 1 tablet by mouth every 4 (four) hours as needed for pain. 15 tablet 0    BP 111/69  Pulse 64  Temp 98 F (36.7 C) (Oral)  Resp 20  Wt 162 lb (73.483 kg)  SpO2 98%  Physical Exam  Nursing note and vitals reviewed. Constitutional: He is oriented to person, place, and time. He appears well-developed and well-nourished. No distress.  HENT:  Head:  Normocephalic and atraumatic.  Eyes: EOM are normal. Pupils are equal, round, and reactive to light.  Neck: Neck supple. No tracheal deviation present.  Cardiovascular: Normal rate.   Pulmonary/Chest: Effort normal. No respiratory distress.  Abdominal: Soft. He exhibits no distension.  Musculoskeletal: Normal range of motion. He exhibits tenderness. He exhibits no edema.       Tender to light palpation over the distal metatarsal  joint. Mild Swelling.   Neurological: He is alert and oriented to person, place, and time. No sensory deficit.       NV intact  Skin: Skin is warm and dry.        No evidence of cellulitis.   Psychiatric: He has a normal mood and affect. His behavior is normal.    ED Course  Procedures (including critical care time) DIAGNOSTIC STUDIES: Oxygen Saturation is 98% on room air, normal by my interpretation.    COORDINATION OF CARE: 23:31: EDMD provides intent to d/c home with pain medication and indomethacin for treatment of gout. Patient agrees to plan of action set at this time.   Labs Reviewed - No data to display Dg Toe Great Left  11/06/2011  *RADIOLOGY REPORT*  Clinical Data: First toe pain.  LEFT GREAT TOE  Comparison: 06/20/2007 CT  Findings: No displaced fracture or dislocation.  No aggressive osseous lesions.  No radiopaque foreign body.  IMPRESSION: No acute osseous abnormality identified.  Original Report Authenticated By: Waneta Martins, M.D.  1. Gout       MDM  I personally performed the services described in this documentation, which was scribed in my presence. The recorded information has been reviewed and considered.         Loren Racer, MD 11/07/11 [phone]

## 2012-06-11 ENCOUNTER — Emergency Department (HOSPITAL_COMMUNITY)
Admission: EM | Admit: 2012-06-11 | Discharge: 2012-06-11 | Disposition: A | Discharge status: Home / Self Care | Payer: Self-pay | Attending: Emergency Medicine | Admitting: Emergency Medicine

## 2012-06-11 ENCOUNTER — Encounter (HOSPITAL_COMMUNITY): Payer: Self-pay

## 2012-06-11 DIAGNOSIS — R05 Cough: Secondary | ICD-10-CM | POA: Insufficient documentation

## 2012-06-11 DIAGNOSIS — J069 Acute upper respiratory infection, unspecified: Secondary | ICD-10-CM | POA: Insufficient documentation

## 2012-06-11 DIAGNOSIS — J029 Acute pharyngitis, unspecified: Secondary | ICD-10-CM | POA: Insufficient documentation

## 2012-06-11 DIAGNOSIS — IMO0001 Reserved for inherently not codable concepts without codable children: Secondary | ICD-10-CM | POA: Insufficient documentation

## 2012-06-11 DIAGNOSIS — R0982 Postnasal drip: Secondary | ICD-10-CM | POA: Insufficient documentation

## 2012-06-11 DIAGNOSIS — R6889 Other general symptoms and signs: Secondary | ICD-10-CM

## 2012-06-11 DIAGNOSIS — R51 Headache: Secondary | ICD-10-CM | POA: Insufficient documentation

## 2012-06-11 DIAGNOSIS — R059 Cough, unspecified: Secondary | ICD-10-CM | POA: Insufficient documentation

## 2012-06-11 MED ORDER — FLUTICASONE PROPIONATE 50 MCG/ACT NA SUSP: 2.0000 | Freq: Every day | NASAL | Status: DC

## 2012-06-11 NOTE — ED Notes (Signed)
Patient reports sinus congestion, sore throat, body aches, and headache x 3 days.

## 2012-06-11 NOTE — ED Provider Notes (Signed)
History   This chart was scribed for non-physician practitioner working with Juliet Rude. Rubin Payor, MD by Leone Payor, ED Scribe. This patient was seen in room WTR7/WTR7 and the patient's care was started at 1205.   CSN: [id number]  Arrival date & time 06/11/12  1205   First MD Initiated Contact with Patient 06/11/12 1307      Chief Complaint  Patient presents with  . flu symptoms      The history is provided by the patient. No language interpreter was used.    KAYLOB WALLEN is a 47 y.o. male who presents to the Emergency Department complaining of sinus congestion starting 3 days ago. Pt has associated headache, body aches, cough with minimal sputum. Pt denies taking any OTC medications for his symptoms. He denies fever, chills, nausea, vomiting. Pt denies having sick contacts and did not receive the flu shot this year.   Pt denies smoking and alcohol use. History reviewed. No pertinent past medical history.  History reviewed. No pertinent past surgical history.  History reviewed. No pertinent family history.  History  Substance Use Topics  . Smoking status: Never Smoker   . Smokeless tobacco: Never Used  . Alcohol Use: No      Review of Systems  A complete 10 system review of systems was obtained and all systems are negative except as noted in the HPI and PMH.    Allergies  Review of patient's allergies indicates no known allergies.  Home Medications  No current outpatient prescriptions on file.  BP 116/70  Pulse 95  Temp 97.8 F (36.6 C) (Oral)  Resp 16  SpO2 100%  Physical Exam  Nursing note and vitals reviewed. Constitutional: He is oriented to person, place, and time. He appears well-developed and well-nourished. No distress.  HENT:  Head: Normocephalic and atraumatic.  Nose: Mucosal edema present.  Mouth/Throat: Oropharynx is clear and moist.       Positive post nasal drip.   Eyes: Conjunctivae normal and EOM are normal.  Neck: Neck supple. No  tracheal deviation present.  Cardiovascular: Normal rate.   Pulmonary/Chest: Effort normal and breath sounds normal. No respiratory distress. He has no wheezes.  Musculoskeletal: Normal range of motion.  Lymphadenopathy:    He has cervical adenopathy.  Neurological: He is alert and oriented to person, place, and time.  Skin: Skin is warm and dry.  Psychiatric: He has a normal mood and affect. His behavior is normal.    ED Course  Procedures (including critical care time)  DIAGNOSTIC STUDIES: Oxygen Saturation is 100% on room air , normal by my interpretation.    COORDINATION OF CARE:   3:12 PM Discussed treatment plan which includes nasal spray, tylenol, and motrin with pt at bedside and pt agreed to plan.    Labs Reviewed - No data to display No results found.   1. Flu-like symptoms   2. URI (upper respiratory infection)       MDM  47 year old male with viral URI/flulike symptoms. He is in no apparent distress. Vitals stable. I will prescribe Flonase nasal spray for his mucosal edema. I advised him to take Tylenol Motrin for his body aches. Return cautions discussed. Patient states understanding of plan and is agreeable.      I personally performed the services described in this documentation, which was scribed in my presence. The recorded information has been reviewed and is accurate.    Trevor Mace, PA-C 06/11/12 1656

## 2012-06-11 NOTE — ED Provider Notes (Signed)
Medical screening examination/treatment/procedure(s) were performed by non-physician practitioner and as supervising physician I was immediately available for consultation/collaboration.  Juliet Rude. Rubin Payor, MD 06/11/12 2348

## 2012-12-03 ENCOUNTER — Emergency Department (HOSPITAL_COMMUNITY)
Admission: EM | Admit: 2012-12-03 | Discharge: 2012-12-03 | Disposition: A | Discharge status: Home / Self Care | Payer: Self-pay | Attending: Emergency Medicine | Admitting: Emergency Medicine

## 2012-12-03 ENCOUNTER — Encounter (HOSPITAL_COMMUNITY): Payer: Self-pay | Admitting: *Deleted

## 2012-12-03 ENCOUNTER — Emergency Department (HOSPITAL_COMMUNITY): Payer: Self-pay

## 2012-12-03 DIAGNOSIS — R079 Chest pain, unspecified: Secondary | ICD-10-CM | POA: Insufficient documentation

## 2012-12-03 DIAGNOSIS — K219 Gastro-esophageal reflux disease without esophagitis: Secondary | ICD-10-CM | POA: Insufficient documentation

## 2012-12-03 LAB — CBC WITH DIFFERENTIAL/PLATELET
Basophils Absolute: 0 10*3/uL (ref 0.0–0.1)
Basophils Relative: 0 % (ref 0–1)
Eosinophils Absolute: 0.1 10*3/uL (ref 0.0–0.7)
HCT: 40 % (ref 39.0–52.0)
Lymphs Abs: 2.6 10*3/uL (ref 0.7–4.0)
MCH: 30.5 pg (ref 26.0–34.0)
MCHC: 35.5 g/dL (ref 30.0–36.0)
MCV: 86 fL (ref 78.0–100.0)
Neutro Abs: 2.1 10*3/uL (ref 1.7–7.7)
RDW: 12.3 % (ref 11.5–15.5)

## 2012-12-03 LAB — BASIC METABOLIC PANEL
BUN: 18 mg/dL (ref 6–23)
Creatinine, Ser: 0.89 mg/dL (ref 0.50–1.35)
GFR calc Af Amer: >90 mL/min (ref >90)
GFR calc non Af Amer: >90 mL/min (ref >90)
Glucose, Bld: 101 mg/dL — ABNORMAL HIGH (ref 70–99)

## 2012-12-03 MED ORDER — FAMOTIDINE 20 MG PO TABS
20.0000 mg | ORAL_TABLET | Freq: Once | ORAL | Status: AC
  Administered 2012-12-03: 20 mg via ORAL
  Filled 2012-12-03: qty 1

## 2012-12-03 MED ORDER — OMEPRAZOLE 20 MG PO CPDR: 20.0000 mg | DELAYED_RELEASE_CAPSULE | Freq: Every day | ORAL | Status: DC

## 2012-12-03 MED ORDER — GI COCKTAIL ~~LOC~~
30.0000 mL | Freq: Once | ORAL | Status: AC
  Administered 2012-12-03: 30 mL via ORAL
  Filled 2012-12-03: qty 30

## 2012-12-03 MED ORDER — ASPIRIN 81 MG PO CHEW
324.0000 mg | CHEWABLE_TABLET | Freq: Once | ORAL | Status: AC
  Administered 2012-12-03: 324 mg via ORAL
  Filled 2012-12-03: qty 4
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
## 2012-12-06 NOTE — ED Provider Notes (Signed)
Medical screening examination/treatment/procedure(s) were conducted as a shared visit with non-physician practitioner(s) and myself.  I personally evaluated the patient during the encounter.   Please see my separate note.    Candyce Churn, MD 12/06/12 1302

## 2012-12-06 NOTE — ED Provider Notes (Signed)
Medical screening examination/treatment/procedure(s) were conducted as a shared visit with non-physician practitioner(s) and myself.  I personally evaluated the patient during the encounter  47 yo male with chest pain worse with eating and lying down.  Well appearing.  Hearts sounds normal, RRR, lung sounds clear, no distress.  Pain consistent with GERD.  ACS and PE very unlikely.  Pain better after GI cocktial.  Advised close PCP follow up.    Candyce Churn, MD 12/06/12 1302

## 2013-05-25 ENCOUNTER — Emergency Department (HOSPITAL_COMMUNITY)
Admission: EM | Admit: 2013-05-25 | Discharge: 2013-05-25 | Disposition: A | Discharge status: Home / Self Care | Payer: Self-pay | Attending: Emergency Medicine | Admitting: Emergency Medicine

## 2013-05-25 ENCOUNTER — Emergency Department (HOSPITAL_COMMUNITY): Payer: Self-pay

## 2013-05-25 ENCOUNTER — Encounter (HOSPITAL_COMMUNITY): Payer: Self-pay | Admitting: Emergency Medicine

## 2013-05-25 DIAGNOSIS — M79673 Pain in unspecified foot: Secondary | ICD-10-CM

## 2013-05-25 DIAGNOSIS — R209 Unspecified disturbances of skin sensation: Secondary | ICD-10-CM | POA: Insufficient documentation

## 2013-05-25 DIAGNOSIS — M79609 Pain in unspecified limb: Secondary | ICD-10-CM | POA: Insufficient documentation

## 2013-05-25 MED ORDER — HYDROCODONE-ACETAMINOPHEN 5-325 MG PO TABS
1.0000 | ORAL_TABLET | Freq: Once | ORAL | Status: AC
  Administered 2013-05-25: 1 via ORAL
  Filled 2013-05-25: qty 1

## 2013-05-25 MED ORDER — HYDROCODONE-ACETAMINOPHEN 5-325 MG PO TABS: 1.0000 | ORAL_TABLET | ORAL | Status: DC | PRN

## 2013-05-25 NOTE — ED Notes (Signed)
Patient state that he has pain to his left big toe and bone next to toe x 4 days

## 2013-05-25 NOTE — Discharge Instructions (Signed)
Take vicodin as prescribed for severe pain.  Do not drive within four hours of taking this medication (may cause drowsiness or confusion).   Take ibuprofen as well; up to 800mg  three times a day with food.   Ice 2-3 times a day for 15-20 minutes. Elevate as often as possible.  Follow up with the orthopedist if your pain has not started to improve in 3-4 days.  Return to the ED if you develop fever or you foot becomes red or swollen.

## 2013-05-25 NOTE — ED Provider Notes (Signed)
CSN: [id number]     Arrival date & time 05/25/13  2039 History  This chart was scribed for Ruby Cola, PA, working with Gavin Pound. Oletta Lamas, MD, by Ardelia Mems ED Scribe. This patient was seen in room WTR5/WTR5 and the patient's care was started at 10:14 PM.   Chief Complaint  Patient presents with  . Foot Pain    The history is provided by the patient. No language interpreter was used.    HPI Comments: PAL SHELL is a 48 y.o. male with no chronic medical conditions who presents to the Emergency Department complaining of gradually worsening "sharp, throbbing" dorsal left foot pain, at the base of his left great toe, over the past 4-5 days. He denies any injury to have onset this pain. He states that he has had intermittent numbness to his left foot for 1 year, preceding his pain. He states his pain is worsened with walking, and that he is unable to bear weight at times. He states that he has been taking Advil without relief of pain. He denies associated swelling to his foot, and states that he has not tried applying ice for his pain. He denies any pain to the plantar aspect of his left foot. He denies having any history of gout.   History reviewed. No pertinent past medical history. History reviewed. No pertinent past surgical history. No family history on file. History  Substance Use Topics  . Smoking status: Never Smoker   . Smokeless tobacco: Never Used  . Alcohol Use: No    Review of Systems  Musculoskeletal:       Left foot pain  All other systems reviewed and are negative.   Allergies  Review of patient's allergies indicates no known allergies.  Home Medications   Current Outpatient Rx  Name  Route  Sig  Dispense  Refill  . HYDROcodone-acetaminophen (NORCO/VICODIN) 5-325 MG per tablet   Oral   Take 1 tablet by mouth every 4 (four) hours as needed for moderate pain.   20 tablet   0     Triage Vitals: BP 122/80  Pulse 78  Temp(Src) 98 F (36.7 C) (Oral)   Resp 18  SpO2 100%  Physical Exam  Nursing note and vitals reviewed. Constitutional: He is oriented to person, place, and time. He appears well-developed and well-nourished. No distress.  HENT:  Head: Normocephalic and atraumatic.  Eyes:  Normal appearance  Neck: Normal range of motion.  Pulmonary/Chest: Effort normal.  Musculoskeletal: Normal range of motion.  L foot w/out deformity, edema, erythema, ecchymosis, rash.  Minimal tenderness at base of 1st and 2nd metatarsals on plantar and dorsal surface, but more severe tenderness w/ guarding on plantar surface between base of  1st and 2nd metatarsals.  No pain w/ ROM of toes.  Brisk cap refill and distal sensation intact.   Neurological: He is alert and oriented to person, place, and time.  Psychiatric: He has a normal mood and affect. His behavior is normal.    ED Course  Procedures (including critical care time)  DIAGNOSTIC STUDIES: Oxygen Saturation is 100% on RA, normal by my interpretation.    COORDINATION OF CARE: 10:19 PM- Discussed radiology findings. Pt advised of plan for treatment and pt agrees.  Labs Review Labs Reviewed - No data to display Imaging Review Dg Foot Complete Left  05/25/2013   CLINICAL DATA:  Pain along the first tarsal of the left foot for the past 4 days.  EXAM: LEFT FOOT - COMPLETE 3+  VIEW  COMPARISON:  Left great toe no radiograph 11/06/2011.  FINDINGS: Multiple views of the left foot demonstrate no acute displaced fracture, subluxation, dislocation, or soft tissue abnormality.  IMPRESSION: No acute radiographic abnormality of the left foot.   Electronically Signed   By: Trudie Reedaniel  Entrikin M.D.   On: 05/25/2013 21:13    EKG Interpretation   None       MDM   1. Foot pain    Healthy 47yo M presents w/ non-traumatic L foot pain, particularly w/ walking x 4-5 days.  Points to pain of great toe and base of 1st and 2nd metatarsals on both dorsal and plantar surface.  Xray negative. 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  Doubt gout  based lack of erythema, warmth and tenderness at 1st MCP joint.  No sign of infectious process.  Prescribed vicodin for pain and recommended NSAID as well.  Ortho tech provided him with a cam walker.  Referred to ortho.  Return precautions discussed.     I personally performed the services described in this documentation, which was scribed in my presence. The recorded information has been reviewed and is accurate.    Otilio Miuatherine E Sharlynn Seckinger, PA-C 05/26/13 [phone]

## 2013-05-27 NOTE — ED Provider Notes (Signed)
Medical screening examination/treatment/procedure(s) were performed by non-physician practitioner and as supervising physician I was immediately available for consultation/collaboration.  EKG Interpretation   None         Jeff PoundMichael Y. Oletta LamasGhim, MD 05/27/13 [id number]

## 2013-09-05 ENCOUNTER — Emergency Department (HOSPITAL_COMMUNITY): Payer: Self-pay

## 2013-09-05 ENCOUNTER — Encounter (HOSPITAL_COMMUNITY): Payer: Self-pay | Admitting: Emergency Medicine

## 2013-09-05 ENCOUNTER — Emergency Department (HOSPITAL_COMMUNITY)
Admission: EM | Admit: 2013-09-05 | Discharge: 2013-09-05 | Disposition: A | Discharge status: Home / Self Care | Payer: Self-pay | Attending: Emergency Medicine | Admitting: Emergency Medicine

## 2013-09-05 DIAGNOSIS — M79672 Pain in left foot: Secondary | ICD-10-CM

## 2013-09-05 DIAGNOSIS — M79609 Pain in unspecified limb: Secondary | ICD-10-CM | POA: Insufficient documentation

## 2013-09-05 DIAGNOSIS — R209 Unspecified disturbances of skin sensation: Secondary | ICD-10-CM | POA: Insufficient documentation

## 2013-09-05 MED ORDER — TRAMADOL HCL 50 MG PO TABS: 50.0000 mg | ORAL_TABLET | Freq: Four times a day (QID) | ORAL | Status: DC | PRN

## 2013-09-05 NOTE — ED Notes (Signed)
Pt ambulatory to exam room with steady gait.  

## 2013-09-05 NOTE — ED Notes (Signed)
Pt seen previously for foot pain.  Pt here today with same.  States pain on top of left foot.  No trauma.  States sometimes he feels numbness

## 2013-09-05 NOTE — ED Provider Notes (Signed)
Medical screening examination/treatment/procedure(s) were performed by non-physician practitioner and as supervising physician I was immediately available for consultation/collaboration.    Celene KrasJon R Kelon Easom, MD 09/05/13 [phone]

## 2013-09-05 NOTE — Discharge Instructions (Signed)
Take tramadol as directed as needed for pain. Follow up with the orthopedist.  Musculoskeletal Pain Musculoskeletal pain is muscle and boney aches and pains. These pains can occur in any part of the body. Your caregiver may treat you without knowing the cause of the pain. They may treat you if blood or urine tests, X-rays, and other tests were normal.  CAUSES There is often not a definite cause or reason for these pains. These pains may be caused by a type of germ (virus). The discomfort may also come from overuse. Overuse includes working out too hard when your body is not fit. Boney aches also come from weather changes. Bone is sensitive to atmospheric pressure changes. HOME CARE INSTRUCTIONS   Ask when your test results will be ready. Make sure you get your test results.  Only take over-the-counter or prescription medicines for pain, discomfort, or fever as directed by your caregiver. If you were given medications for your condition, do not drive, operate machinery or power tools, or sign legal documents for 24 hours. Do not drink alcohol. Do not take sleeping pills or other medications that may interfere with treatment.  Continue all activities unless the activities cause more pain. When the pain lessens, slowly resume normal activities. Gradually increase the intensity and duration of the activities or exercise.  During periods of severe pain, bed rest may be helpful. Lay or sit in any position that is comfortable.  Putting ice on the injured area.  Put ice in a bag.  Place a towel between your skin and the bag.  Leave the ice on for 15 to 20 minutes, 3 to 4 times a day.  Follow up with your caregiver for continued problems and no reason can be found for the pain. If the pain becomes worse or does not go away, it may be necessary to repeat tests or do additional testing. Your caregiver may need to look further for a possible cause. SEEK IMMEDIATE MEDICAL CARE IF:  You have pain that  is getting worse and is not relieved by medications.  You develop chest pain that is associated with shortness or breath, sweating, feeling sick to your stomach (nauseous), or throw up (vomit).  Your pain becomes localized to the abdomen.  You develop any new symptoms that seem different or that concern you. MAKE SURE YOU:   Understand these instructions.  Will watch your condition.  Will get help right away if you are not doing well or get worse. Document Released: 04/26/2005 Document Revised: 07/19/2011 Document Reviewed: 12/29/2012 [facility] Patient Information 2014 [place], [place].

## 2013-09-05 NOTE — ED Provider Notes (Signed)
CSN: [id number]     Arrival date & time 09/05/13  1742 History  This chart was scribed for non-physician practitioner, Johnnette Gourdobyn Albert, PA-C, working with Celene KrasJon R Knapp, MD, by Ellin MayhewMichael Levi, ED Scribe. This patient was seen in room WTR8/WTR8 and the patient's care was started at 6:13 PM.  The history is provided by the patient and a friend. No language interpreter was used.   HPI Comments: Jeff Perry is a 48 y.o. male who presents to the Emergency Department with a chief complaint of recurrent, atraumtic L foot pain that has been ongoing for 1 year. Patient was last seen on 3 months ago for atraumatic L foot pain in the ED, had a negative xray and was prescribed Vicodin. At that time, patient was advised to follow up with an orthopedist. Patient reports that there has been no improvement and locates the pain to the top of foot near his toes. Patient characterizes the pain as a sharp/throbbing pain without radiation which he rates as a 10/10. Additionally, patient reports associated numbness and paresthesias. He reports that the pain is constant, progressively worsening and made worse with walking. He reports that he had similar symptoms in his arm several months ago that has since resolved. Patient reports he has tried taking Advil with no relief. He denies any back pain, knee pain, fever, chills.   History reviewed. No pertinent past medical history. History reviewed. No pertinent past surgical history. History reviewed. No pertinent family history. History  Substance Use Topics  . Smoking status: Never Smoker   . Smokeless tobacco: Never Used  . Alcohol Use: No    Review of Systems  Constitutional: Negative for fever and chills.  Respiratory: Negative for cough and shortness of breath.   Cardiovascular: Negative for chest pain.  Gastrointestinal: Negative for nausea, vomiting, abdominal pain and diarrhea.  Musculoskeletal: Positive for arthralgias. Negative for back pain, gait problem, joint  swelling, neck pain and neck stiffness.  Neurological: Positive for numbness. Negative for dizziness, weakness, light-headedness and headaches.  A complete 10 system review of systems was obtained and all systems are negative except as noted in the HPI and PMH.   Allergies  Review of patient's allergies indicates no known allergies.  Home Medications   Prior to Admission medications   Medication Sig Start Date End Date Taking? Authorizing Provider  HYDROcodone-acetaminophen (NORCO/VICODIN) 5-325 MG per tablet Take 1 tablet by mouth every 4 (four) hours as needed for moderate pain. 05/25/13   Arie Sabinaatherine E Schinlever, PA-C   Triage Vitals: BP 116/80  Pulse 69  Temp(Src) 98.1 F (36.7 C) (Oral)  Resp 16  SpO2 99%  Physical Exam  Nursing note and vitals reviewed. Constitutional: He is oriented to person, place, and time. He appears well-developed and well-nourished. No distress.  HENT:  Head: Normocephalic and atraumatic.  Eyes: Conjunctivae and EOM are normal.  Neck: Normal range of motion. Neck supple.  Cardiovascular: Normal rate, regular rhythm, normal heart sounds and intact distal pulses.   Pulmonary/Chest: Effort normal and breath sounds normal.  Musculoskeletal: Normal range of motion. He exhibits no edema.       Left foot: He exhibits tenderness (TTP along plantar fascia and 3rd, 4th, 5th metatarsals.). He exhibits normal range of motion and no swelling.  No erythema. Foot mobile.  Neurological: He is alert and oriented to person, place, and time. He has normal strength. No sensory deficit.  Skin: Skin is warm and dry.  Psychiatric: He has a normal mood and affect.  His behavior is normal.   ED Course  Procedures (including critical care time)  COORDINATION OF CARE: 6:19 PM-Ordered an xray and will f/u with patient. Treatment plan discussed with patient and patient agrees.  6:54 PM-Disucssed negative xray findings with patient.  Imaging Review Dg Foot Complete  Left  09/05/2013   CLINICAL DATA:  Foot pain on plantar and dorsal surfaces  EXAM: LEFT FOOT - COMPLETE 3+ VIEW  COMPARISON:  DG FOOT COMPLETE*L* dated 05/25/2013  FINDINGS: The bones of the left foot are adequately mineralized. There is no evidence of an acute or healing or old fracture. There is no dislocation. The interphalangeal joints and the metatarsophalangeal joints appear normal. The bones of the hindfoot appear intact. The overlying soft tissues demonstrate no abnormal swelling nor foreign bodies.  IMPRESSION: There is no acute bony abnormality of the left foot.   Electronically Signed   By: David  SwazilandJordan   On: 09/05/2013 18:40    MDM   Final diagnoses:  Left foot pain    Agent presenting with left foot pain. He is well appearing and in no apparent distress. Vital signs stable. Neurovascularly intact. X-ray without any acute findings. He's had these problems for the past year. Tenderness along the plantar fascia, possible plantar fasciitis. Regarding pain over metatarsals, no overlying erythema or edema concerning for infection. Advised followup with ortho, will give tramadol for pain. Stable for discharge. Return precautions given. Patient states understanding of treatment care plan and is agreeable.    I personally performed the services described in this documentation, which was scribed in my presence. The recorded information has been reviewed and is accurate.    Trevor MaceRobyn M Albert, PA-C 09/05/13 1859

## 2015-01-03 ENCOUNTER — Emergency Department (INDEPENDENT_AMBULATORY_CARE_PROVIDER_SITE_OTHER)
Admission: EM | Admit: 2015-01-03 | Discharge: 2015-01-03 | Disposition: A | Discharge status: Home / Self Care | Payer: Self-pay | Source: Home / Self Care | Attending: Family Medicine | Admitting: Family Medicine

## 2015-01-03 ENCOUNTER — Encounter (HOSPITAL_COMMUNITY): Payer: Self-pay

## 2015-01-03 DIAGNOSIS — M5416 Radiculopathy, lumbar region: Principal | ICD-10-CM

## 2015-01-03 DIAGNOSIS — G8929 Other chronic pain: Secondary | ICD-10-CM

## 2015-01-03 DIAGNOSIS — M5410 Radiculopathy, site unspecified: Secondary | ICD-10-CM

## 2015-01-03 MED ORDER — TRAMADOL HCL 50 MG PO TABS: 50.0000 mg | ORAL_TABLET | Freq: Four times a day (QID) | ORAL | Status: DC | PRN

## 2015-01-03 MED ORDER — PREDNISONE 20 MG PO TABS: ORAL_TABLET | ORAL | Status: DC

## 2015-01-03 NOTE — Discharge Instructions (Signed)
Chronic Back Pain ° When back pain lasts longer than 3 months, it is called chronic back pain. People with chronic back pain often go through certain periods that are more intense (flare-ups).  °CAUSES °Chronic back pain can be caused by wear and tear (degeneration) on different structures in your back. These structures include: °· The bones of your spine (vertebrae) and the joints surrounding your spinal cord and nerve roots (facets). °· The strong, fibrous tissues that connect your vertebrae (ligaments). °Degeneration of these structures may result in pressure on your nerves. This can lead to constant pain. °HOME CARE INSTRUCTIONS °· Avoid bending, heavy lifting, prolonged sitting, and activities which make the problem worse. °· Take brief periods of rest throughout the day to reduce your pain. Lying down or standing usually is better than sitting while you are resting. °· Take over-the-counter or prescription medicines only as directed by your caregiver. °SEEK IMMEDIATE MEDICAL CARE IF:  °· You have weakness or numbness in one of your legs or feet. °· You have trouble controlling your bladder or bowels. °· You have nausea, vomiting, abdominal pain, shortness of breath, or fainting. °Document Released: 06/03/2004 Document Revised: 07/19/2011 Document Reviewed: 04/10/2011 °ExitCare® Patient Information ©2015 [employer] LLC. This information is not intended to replace advice given to you by your health care provider. Make sure you discuss any questions you have with your health care provider. ° °Lumbosacral Radiculopathy °Lumbosacral radiculopathy is a pinched nerve or nerves in the low back (lumbosacral area). When this happens you may have weakness in your legs and may not be able to stand on your toes. You may have pain going down into your legs. There may be difficulties with walking normally. There are many causes of this problem. Sometimes this may happen from an injury, or simply from arthritis or boney  problems. It may also be caused by other illnesses such as diabetes. If there is no improvement after treatment, further studies may be done to find the exact cause. °DIAGNOSIS  °X-rays may be needed if the problems become long standing. Electromyograms may be done. This study is one in which the working of nerves and muscles is studied. °HOME CARE INSTRUCTIONS  °· Applications of ice packs may be helpful. Ice can be used in a plastic bag with a towel around it to prevent frostbite to skin. This may be used every 2 hours for 20 to 30 minutes, or as needed, while awake, or as directed by your caregiver. °· Only take over-the-counter or prescription medicines for pain, discomfort, or fever as directed by your caregiver. °· If physical therapy was prescribed, follow your caregiver's directions. °SEEK IMMEDIATE MEDICAL CARE IF:  °· You have pain not controlled with medications. °· You seem to be getting worse rather than better. °· You develop increasing weakness in your legs. °· You develop loss of bowel or bladder control. °· You have difficulty with walking or balance, or develop clumsiness in the use of your legs. °· You have a fever. °MAKE SURE YOU:  °· Understand these instructions. °· Will watch your condition. °· Will get help right away if you are not doing well or get worse. °Document Released: 04/26/2005 Document Revised: 07/19/2011 Document Reviewed: 12/15/2007 °ExitCare® Patient Information ©2015 [employer]. This information is not intended to replace advice given to you by your health care provider. Make sure you discuss any questions you have with your health care provider. ° °

## 2015-01-03 NOTE — ED Notes (Signed)
Patient complains of having lower back pain that Extends down his legs for a long time Patient was prescribed oxycodone back in may but his  Primary doctor has since gone back to his country

## 2015-01-03 NOTE — ED Provider Notes (Signed)
CSN: [id number]     Arrival date & time 01/03/15  1956 History   First MD Initiated Contact with Patient 01/03/15 2047     Chief Complaint  Patient presents with  . Back Pain   (Consider location/radiation/quality/duration/timing/severity/associated sxs/prior Treatment) HPI Comments: 49 year old Hispanic male complaining of sharp throbbing pain in the mid lumbar spine and right para lumbar musculature. The pain radiates to the back of his right leg. It is often worse at night and after he works. His job requires heavy lifting and his friend states that he does not lift properly and that aggravates his pain. After he goes home the muscles in his back hurt worse. He has had chronic pain for at least 2 years. He was seeing a physician out of this county and was giving him several different medications including muscle relaxants, anti-inflammatory's and oxycodone. According to the patient's interpreter's physician left approximate 2 months ago in the office closed immediately. He has not been able to get his oxycodone refilled. The pain, location and intensity has not changed.  Patient is a 49 y.o. male presenting with back pain.  Back Pain Location:  Lumbar spine Quality:  Aching and stabbing Radiates to:  R posterior upper leg Pain severity:  Moderate Pain is:  Worse during the night Onset quality:  Sudden Duration:  36 months Timing:  Intermittent Progression:  Waxing and waning Chronicity:  Chronic Context: lifting heavy objects, occupational injury, physical stress and twisting   Relieved by:  Narcotics Worsened by:  Movement and palpation Ineffective treatments:  Narcotics Associated symptoms: no bladder incontinence, no chest pain, no fever, no headaches, no numbness, no perianal numbness, no tingling and no weakness     History reviewed. No pertinent past medical history. History reviewed. No pertinent past surgical history. No family history on file. Social History  Substance  Use Topics  . Smoking status: Never Smoker   . Smokeless tobacco: Never Used  . Alcohol Use: No    Review of Systems  Constitutional: Negative.  Negative for fever.  Respiratory: Negative.   Cardiovascular: Negative for chest pain.  Gastrointestinal: Negative.   Genitourinary: Negative.  Negative for bladder incontinence.  Musculoskeletal: Positive for myalgias and back pain.       As per HPI  Skin: Negative.   Neurological: Negative for dizziness, tingling, weakness, numbness and headaches.    Allergies  Review of patient's allergies indicates no known allergies.  Home Medications   Prior to Admission medications   Medication Sig Start Date End Date Taking? Authorizing Provider  HYDROcodone-acetaminophen (NORCO/VICODIN) 5-325 MG per tablet Take 1 tablet by mouth every 4 (four) hours as needed for moderate pain. 05/25/13   Catherine Schinlever, PA-C  predniSONE (DELTASONE) 20 MG tablet 3 Tabs PO Days 1-3, then 2 tabs PO Days 4-6, then 1 tab PO Day 7-9, then Half Tab PO Day 10-12 01/03/15   Hayden Rasmussen, NP  traMADol (ULTRAM) 50 MG tablet Take 1 tablet (50 mg total) by mouth every 6 (six) hours as needed. 01/03/15   Hayden Rasmussen, NP   Meds Ordered and Administered this Visit  Medications - No data to display  There were no vitals taken for this visit. No data found.   Physical Exam  Constitutional: He is oriented to person, place, and time. He appears well-developed and well-nourished.  HENT:  Head: Normocephalic and atraumatic.  Eyes: EOM are normal. Left eye exhibits no discharge.  Neck: Normal range of motion. Neck supple.  Musculoskeletal:  Tenderness across  the lower lumbar spine and right paralumbar musculature. Patient is able to get onto and off the table without difficulty. No spinal deformity, step-off deformity, swelling or discoloration.  Neurological: He is alert and oriented to person, place, and time. No cranial nerve deficit.  Skin: Skin is warm and dry.   Psychiatric: He has a normal mood and affect.  Nursing note and vitals reviewed.   ED Course  Procedures (including critical care time)  Labs Review Labs Reviewed - No data to display  Imaging Review No results found.   Visual Acuity Review  Right Eye Distance:   Left Eye Distance:   Bilateral Distance:    Right Eye Near:   Left Eye Near:    Bilateral Near:         MDM   1. Chronic radicular low back pain    prdnisone taper Tramadol 50 mg #15. Must locate a PCP to further manage her chronic back pain.   Hayden Rasmussen, NP 01/03/15 2106

## 2015-05-27 ENCOUNTER — Encounter (HOSPITAL_COMMUNITY): Payer: Self-pay | Admitting: Emergency Medicine

## 2015-05-27 ENCOUNTER — Emergency Department (HOSPITAL_COMMUNITY): Payer: Self-pay

## 2015-05-27 ENCOUNTER — Emergency Department (HOSPITAL_COMMUNITY)
Admission: EM | Admit: 2015-05-27 | Discharge: 2015-05-27 | Disposition: A | Discharge status: Home / Self Care | Payer: Self-pay | Attending: Emergency Medicine | Admitting: Emergency Medicine

## 2015-05-27 DIAGNOSIS — R109 Unspecified abdominal pain: Secondary | ICD-10-CM | POA: Insufficient documentation

## 2015-05-27 LAB — BASIC METABOLIC PANEL
Anion gap: 10 (ref 5–15)
BUN: 21 mg/dL — AB (ref 6–20)
CALCIUM: 9.2 mg/dL (ref 8.9–10.3)
CO2: 26 mmol/L (ref 22–32)
CREATININE: 1 mg/dL (ref 0.61–1.24)
Chloride: 101 mmol/L (ref 101–111)
GFR calc Af Amer: >60 mL/min (ref >60)
GLUCOSE: 93 mg/dL (ref 65–99)
Potassium: 4 mmol/L (ref 3.5–5.1)
SODIUM: 137 mmol/L (ref 135–145)

## 2015-05-27 LAB — URINE MICROSCOPIC-ADD ON

## 2015-05-27 LAB — CBC WITH DIFFERENTIAL/PLATELET
Basophils Absolute: 0 10*3/uL (ref 0.0–0.1)
Basophils Relative: 0 %
EOS PCT: 2 %
Eosinophils Absolute: 0.1 10*3/uL (ref 0.0–0.7)
HCT: 41.3 % (ref 39.0–52.0)
Hemoglobin: 14.1 g/dL (ref 13.0–17.0)
LYMPHS ABS: 1.5 10*3/uL (ref 0.7–4.0)
LYMPHS PCT: 31 %
MCH: 30.5 pg (ref 26.0–34.0)
MCHC: 34.1 g/dL (ref 30.0–36.0)
MCV: 89.4 fL (ref 78.0–100.0)
Monocytes Absolute: 0.5 10*3/uL (ref 0.1–1.0)
Monocytes Relative: 11 %
NEUTROS ABS: 2.7 10*3/uL (ref 1.7–7.7)
Neutrophils Relative %: 56 %
PLATELETS: 208 10*3/uL (ref 150–400)
RBC: 4.62 MIL/uL (ref 4.22–5.81)
RDW: 12.8 % (ref 11.5–15.5)
WBC: 4.9 10*3/uL (ref 4.0–10.5)

## 2015-05-27 LAB — URINALYSIS, ROUTINE W REFLEX MICROSCOPIC
Glucose, UA: NEGATIVE mg/dL
Ketones, ur: >80 mg/dL — AB
Leukocytes, UA: NEGATIVE
Nitrite: NEGATIVE
Protein, ur: NEGATIVE mg/dL
Specific Gravity, Urine: 1.03 (ref 1.005–1.030)
pH: 6 (ref 5.0–8.0)

## 2015-05-27 MED ORDER — KETOROLAC TROMETHAMINE 60 MG/2ML IM SOLN
60.0000 mg | Freq: Once | INTRAMUSCULAR | Status: AC
  Administered 2015-05-27: 60 mg via INTRAMUSCULAR
  Filled 2015-05-27: qty 2

## 2015-05-27 MED ORDER — DIAZEPAM 5 MG PO TABS
5.0000 mg | ORAL_TABLET | Freq: Once | ORAL | Status: AC
  Administered 2015-05-27: 5 mg via ORAL
  Filled 2015-05-27: qty 1

## 2015-05-27 NOTE — ED Notes (Signed)
Discharge instructions and follow up care reviewed with patient. Patient verbalized understanding. 

## 2015-05-27 NOTE — Discharge Instructions (Signed)
Take 4 over the counter ibuprofen tablets 3 times a day or 2 over-the-counter naproxen tablets twice a day for pain. ° °

## 2015-05-27 NOTE — ED Provider Notes (Signed)
CSN: [id number]     Arrival date & time 05/27/15  1108 History   First MD Initiated Contact with Patient 05/27/15 1636     Chief Complaint  Patient presents with  . Back Pain     (Consider location/radiation/quality/duration/timing/severity/associated sxs/prior Treatment) Patient is a 50 y.o. male presenting with back pain. The history is provided by the patient.  Back Pain Location:  Lumbar spine Quality:  Aching Radiates to:  Does not radiate Pain severity:  Moderate Pain is:  Same all the time Onset quality:  Gradual Duration:  2 days Timing:  Constant Progression:  Worsening Chronicity:  New Relieved by:  Nothing Worsened by:  Movement and palpation Ineffective treatments:  None tried Associated symptoms: no abdominal pain, no abdominal swelling, no bladder incontinence, no bowel incontinence, no chest pain, no fever, no headaches and no leg pain   Risk factors: lack of exercise     50 yo M with a chief complaint of left-sided low back pain. This started yesterday. Patient has a history of low back pain but states it's usually on the other side. The patient told the nurse that it started after he had lifted a heavy object at work. Patient told me that it happened about 20 minutes after he urinated. Denies any pain with urination. Pain is left sacroiliac. No noted radiation. Pain with ambulation. Pain with movement palpation. Denies trauma.  History reviewed. No pertinent past medical history. History reviewed. No pertinent past surgical history. History reviewed. No pertinent family history. Social History  Substance Use Topics  . Smoking status: Never Smoker   . Smokeless tobacco: Never Used  . Alcohol Use: No    Review of Systems  Constitutional: Negative for fever and chills.  HENT: Negative for congestion and facial swelling.   Eyes: Negative for discharge and visual disturbance.  Respiratory: Negative for shortness of breath.   Cardiovascular: Negative for chest  pain and palpitations.  Gastrointestinal: Negative for vomiting, abdominal pain, diarrhea and bowel incontinence.  Genitourinary: Negative for bladder incontinence.  Musculoskeletal: Positive for myalgias and back pain. Negative for arthralgias.  Skin: Negative for color change and rash.  Neurological: Negative for tremors, syncope and headaches.  Psychiatric/Behavioral: Negative for confusion and dysphoric mood.      Allergies  Review of patient's allergies indicates no known allergies.  Home Medications   Prior to Admission medications   Not on File   BP 124/78 mmHg  Pulse 89  Temp(Src) 97.9 F (36.6 C) (Oral)  Resp 16  SpO2 97% Physical Exam  Constitutional: He is oriented to person, place, and time. He appears well-developed and well-nourished.  HENT:  Head: Normocephalic and atraumatic.  Eyes: EOM are normal. Pupils are equal, round, and reactive to light.  Neck: Normal range of motion. Neck supple. No JVD present.  Cardiovascular: Normal rate and regular rhythm.  Exam reveals no gallop and no friction rub.   No murmur heard. Pulmonary/Chest: No respiratory distress. He has no wheezes.  Abdominal: He exhibits no distension. There is no tenderness. There is no rebound and no guarding.  Musculoskeletal: Normal range of motion. He exhibits tenderness.  Able to ambulate with painful gait.  Muscle strength 5/5 and bilateral.  Reflexes 2+, no clonus  Neurological: He is alert and oriented to person, place, and time.  Skin: No rash noted. No pallor.  Psychiatric: He has a normal mood and affect. His behavior is normal.  Nursing note and vitals reviewed.   ED Course  Procedures (including critical  care time) Labs Review Labs Reviewed  URINALYSIS, ROUTINE W REFLEX MICROSCOPIC (NOT AT [facility]) - Abnormal; Notable for the following:    Hgb urine dipstick TRACE (*)    Bilirubin Urine SMALL (*)    Ketones, ur >80 (*)    All other components within normal limits  URINE  MICROSCOPIC-ADD ON - Abnormal; Notable for the following:    Squamous Epithelial / LPF 0-5 (*)    Bacteria, UA RARE (*)    All other components within normal limits  BASIC METABOLIC PANEL - Abnormal; Notable for the following:    BUN 21 (*)    All other components within normal limits  CBC WITH DIFFERENTIAL/PLATELET    Imaging Review Ct Renal Stone Study  05/27/2015  CLINICAL DATA:  Old left lower back lifting heavy object yesterday. Left flank pain. Worse with movement. EXAM: CT ABDOMEN AND PELVIS WITHOUT CONTRAST TECHNIQUE: Multidetector CT imaging of the abdomen and pelvis was performed following the standard protocol without IV contrast. COMPARISON:  10/22/2003 FINDINGS: Minimal dependent atelectasis in the lung bases. Heart is normal size. No effusions. Liver, gallbladder, spleen, pancreas, adrenals and kidneys have an unremarkable unenhanced appearance. No renal or ureteral stones. No hydronephrosis. Aorta is normal caliber. Bowel grossly unremarkable. No free fluid, free air, or adenopathy. Appendix not definitively seen, but no inflammatory process in the right lower quadrant. Urinary bladder is decompressed. No acute bony abnormality or focal bone lesion. IMPRESSION: No acute findings in the abdomen or pelvis on this unenhanced study. Electronically Signed   By: Charlett Nose M.D.   On: 05/27/2015 20:17   I have personally reviewed and evaluated these images and lab results as part of my medical decision-making.   EKG Interpretation None      MDM   Final diagnoses:  Left flank pain    50 yo M with a chief complaints of left SI joint pain. Patient able to walk but with painful gait. Patient with dramatic presentation. Question narcotic seeking with prior note stating that he had [place]. Will obtain a UA as patient said it was related to urination.  UA with blood, protein and ketones. CT stone study negative.  BMP unremarkable.   D/c home.  NSAIDs for pain.  11:43 PM:  I have discussed the diagnosis/risks/treatment options with the patient and family and believe the pt to be eligible for discharge home to follow-up with PCP. We also discussed returning to the ED immediately if new or worsening sx occur. We discussed the sx which are most concerning (e.g., sudden worsening pain, fever, inability to tolerate by mouth) that necessitate immediate return. Medications administered to the patient during their visit and any new prescriptions provided to the patient are listed below.  Medications given during this visit Medications  ketorolac (TORADOL) injection 60 mg (60 mg Intramuscular Given 05/27/15 1756)  diazepam (VALIUM) tablet 5 mg (5 mg Oral Given 05/27/15 1756)    There are no discharge medications for this patient.   The patient appears reasonably screen and/or stabilized for discharge and I doubt any other medical condition or other [facility] requiring further screening, evaluation, or treatment in the ED at this time prior to discharge.    Melene Plan, DO 05/27/15 2343

## 2015-05-27 NOTE — ED Notes (Signed)
Pt pulled L lower back after lifting a heavy object yesterday. Pain worsens with movement.

## 2015-08-23 ENCOUNTER — Emergency Department (HOSPITAL_COMMUNITY)
Admission: EM | Admit: 2015-08-23 | Discharge: 2015-08-24 | Disposition: A | Discharge status: Home / Self Care | Payer: Self-pay | Attending: Emergency Medicine | Admitting: Emergency Medicine

## 2015-08-23 ENCOUNTER — Encounter (HOSPITAL_COMMUNITY): Payer: Self-pay | Admitting: Emergency Medicine

## 2015-08-23 DIAGNOSIS — Y998 Other external cause status: Secondary | ICD-10-CM | POA: Insufficient documentation

## 2015-08-23 DIAGNOSIS — X58XXXA Exposure to other specified factors, initial encounter: Secondary | ICD-10-CM | POA: Insufficient documentation

## 2015-08-23 DIAGNOSIS — S0502XA Injury of conjunctiva and corneal abrasion without foreign body, left eye, initial encounter: Secondary | ICD-10-CM | POA: Insufficient documentation

## 2015-08-23 DIAGNOSIS — Y9389 Activity, other specified: Secondary | ICD-10-CM | POA: Insufficient documentation

## 2015-08-23 DIAGNOSIS — Y9289 Other specified places as the place of occurrence of the external cause: Secondary | ICD-10-CM | POA: Insufficient documentation

## 2015-08-23 NOTE — ED Notes (Signed)
Pt c/o L eye pain, pt states he was grinding metal while fixing his car and feels he has a piece of metal in eye.

## 2015-08-24 MED ORDER — TETRACAINE HCL 0.5 % OP SOLN
2.0000 [drp] | Freq: Once | OPHTHALMIC | Status: AC
  Administered 2015-08-24: 2 [drp] via OPHTHALMIC
  Filled 2015-08-24: qty 4

## 2015-08-24 MED ORDER — TETANUS-DIPHTH-ACELL PERTUSSIS 5-2.5-18.5 LF-MCG/0.5 IM SUSP
0.5000 mL | Freq: Once | INTRAMUSCULAR | Status: DC
  Filled 2015-08-24: qty 0.5

## 2015-08-24 MED ORDER — FLUORESCEIN SODIUM 1 MG OP STRP
1.0000 | ORAL_STRIP | Freq: Once | OPHTHALMIC | Status: AC
  Administered 2015-08-24: 1 via OPHTHALMIC
  Filled 2015-08-24: qty 1

## 2015-08-24 MED ORDER — ERYTHROMYCIN 2 % EX OINT: TOPICAL_OINTMENT | CUTANEOUS | Status: DC

## 2015-08-24 NOTE — ED Provider Notes (Signed)
CSN: [id number]     Arrival date & time 08/23/15  2111 History  By signing my name below, I, Doreatha MartinEva Mathews, attest that this documentation has been prepared under the direction and in the presence of Shon Batonourtney F Saliou Barnier, MD. Electronically Signed: Doreatha MartinEva Mathews, ED Scribe. 08/24/2015. 2:47 AM.    Chief Complaint  Patient presents with  . Eye Problem   The history is provided by the patient. No language interpreter was used.   HPI Comments: Jeff Perry is a 50 y.o. male who presents to the Emergency Department complaining of erythema and a foreign body sensation to the left eye onset at 3PM yesterday. 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 He states that ocular movement worsens his symptoms. Pt denies taking OTC medications at home to improve symptoms. Tdap out of date. Denies visual disturbance, eye pain. Denies any symptoms in the right eye.   History reviewed. No pertinent past medical history. History reviewed. No pertinent past surgical history. No family history on file. Social History  Substance Use Topics  . Smoking status: Never Smoker   . Smokeless tobacco: Never Used  . Alcohol Use: No    Review of Systems  Eyes: Positive for redness. Negative for pain and visual disturbance.       +foreign body sensation left eye  All other systems reviewed and are negative.  Allergies  Review of patient's allergies indicates no known allergies.  Home Medications   Prior to Admission medications   Medication Sig Start Date End Date Taking? Authorizing Provider  Erythromycin 2 % ointment Apply to affected area 2 times daily 08/24/15   Shon Batonourtney F Lolamae Voisin, MD   BP 115/84 mmHg  Pulse 57  Temp(Src) 98.3 F (36.8 C) (Oral)  Resp 19  SpO2 100% Physical Exam  Constitutional: He is oriented to person, place, and time. He appears well-developed and well-nourished. No distress.  HENT:   Head: Normocephalic and atraumatic.  Eyes: Conjunctivae and EOM are normal. Pupils are equal, round, and reactive to light.  20/20 vision right eye, left eye, bilaterally Mild fluoroscein uptake noted over the inferior lateral aspect of the left iris, no foreign bodies noted, no foreign bodies noted with lid inversion  Cardiovascular: Normal rate and regular rhythm.   No murmur heard. Pulmonary/Chest: No respiratory distress. He has no wheezes.  Musculoskeletal: He exhibits no edema.  Neurological: He is alert and oriented to person, place, and time.  Skin: Skin is warm and dry.  Psychiatric: He has a normal mood and affect.  Nursing note and vitals reviewed.   ED Course  Procedures (including critical care time) DIAGNOSTIC STUDIES: Oxygen Saturation is 99% on RA, normal by my interpretation.    COORDINATION OF CARE: 2:42 AM Discussed treatment plan with pt at bedside which includes antibiotic eye drops and pt agreed to plan.    MDM   Final diagnoses:  Corneal abrasion, left, initial encounter    She presents with foreign body sensation in the left eye. Denies pain. 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 No foreign body noted in the. He may have a mild corneal abrasion over the lateral aspect of the left eye. Patient's tetanus was updated. Visual acuity is intact. Erythromycin ointment. NSAIDs for pain; however, patient is not having any significant  pain at this time.  After history, exam, and medical workup I feel the patient has been appropriately medically screened and is safe for discharge home. Pertinent diagnoses were discussed with the patient. Patient was given return precautions.  I personally performed the services described in this documentation, which was scribed in my presence. The recorded information has been reviewed and is accurate.   Shon Baton, MD 08/24/15 [phone]

## 2015-08-24 NOTE — Discharge Instructions (Signed)

## 2016-07-08 ENCOUNTER — Ambulatory Visit (INDEPENDENT_AMBULATORY_CARE_PROVIDER_SITE_OTHER): Payer: Self-pay | Admitting: Physician Assistant

## 2016-07-08 ENCOUNTER — Ambulatory Visit (INDEPENDENT_AMBULATORY_CARE_PROVIDER_SITE_OTHER): Payer: Self-pay

## 2016-07-08 VITALS — BP 110/78 | HR 60 | Temp 98.1°F | Resp 16 | Ht 67.0 in | Wt 178.0 lb

## 2016-07-08 DIAGNOSIS — K219 Gastro-esophageal reflux disease without esophagitis: Secondary | ICD-10-CM

## 2016-07-08 DIAGNOSIS — R0981 Nasal congestion: Secondary | ICD-10-CM

## 2016-07-08 DIAGNOSIS — R059 Cough, unspecified: Secondary | ICD-10-CM

## 2016-07-08 DIAGNOSIS — R05 Cough: Secondary | ICD-10-CM

## 2016-07-08 LAB — POCT CBC
Granulocyte percent: 51.3 % (ref 37–80)
HCT, POC: 41.1 % — AB (ref 43.5–53.7)
Hemoglobin: 14.5 g/dL (ref 14.1–18.1)
Lymph, poc: 2.8 (ref 0.6–3.4)
MCH, POC: 30.9 pg (ref 27–31.2)
MCHC: 35.3 g/dL (ref 31.8–35.4)
MCV: 87.5 fL (ref 80–97)
MID (cbc): 0.2 (ref 0–0.9)
MPV: 8.4 fL (ref 0–99.8)
POC Granulocyte: 3.2 (ref 2–6.9)
POC LYMPH PERCENT: 44.8 %L (ref 10–50)
POC MID %: 3.9 % (ref 0–12)
Platelet Count, POC: 246 10*3/uL (ref 142–424)
RBC: 4.7 M/uL (ref 4.69–6.13)
RDW, POC: 12.3 %
WBC: 6.3 10*3/uL (ref 4.6–10.2)

## 2016-07-08 MED ORDER — FLUTICASONE PROPIONATE 50 MCG/ACT NA SUSP: 2.0000 | Freq: Every day | NASAL | 6 refills | Status: AC

## 2016-07-08 MED ORDER — PANTOPRAZOLE SODIUM 40 MG PO TBEC: 40.0000 mg | DELAYED_RELEASE_TABLET | Freq: Every day | ORAL | 3 refills | Status: AC

## 2016-07-08 MED ORDER — MUCINEX DM MAXIMUM STRENGTH 60-1200 MG PO TB12: 1.0000 | ORAL_TABLET | Freq: Two times a day (BID) | ORAL | 1 refills | Status: AC

## 2016-07-08 MED ORDER — FAMOTIDINE 20 MG PO TABS: 20.0000 mg | ORAL_TABLET | Freq: Every day | ORAL | 1 refills | Status: AC

## 2016-07-08 MED ORDER — AMOXICILLIN-POT CLAVULANATE 875-125 MG PO TABS: 1.0000 | ORAL_TABLET | Freq: Two times a day (BID) | ORAL | 0 refills | Status: AC

## 2016-07-08 NOTE — Patient Instructions (Addendum)
GERD (REFLUX) can be treated with medication, but also with lifestyle changes including elevation of the head of your bed (ideally with 6 inch  bed blocks),  Smoking cessation, avoidance of late meals, excessive alcohol, and avoid fatty foods, chocolate, peppermint, colas, red wine, and acidic juices such as orange juice.  NO MINT OR MENTHOL PRODUCTS SO NO COUGH DROPS   USE SUGARLESS CANDY INSTEAD (Jolley ranchers or Stover's or Life Savers) or even ice chips will also do - the key is to swallow to prevent all throat clearing. NO OIL BASED VITAMINS - use powdered substitutes.  Please follow these instructions:  -Augmentin 875 - take twice a day x 10 days - one with breakfast, one with dinner, both with tall glasses of water as this will help reduce side effects (yeast infection and diarrhea. Eat cultured yogurt at lunch to reduce these risk).   -Mucinex 1200 mg q every 12 hours   -Protonix 54m - take 30-60 minutes before the first meal of the day. And Pepcid (famotidine) 265mone at bedtime.   -Nasal saline spray for sinus symptoms.    -Come back in 4-6 weeks for follow-up of your symptoms.    Please schedule an annual exam at check-out today.   Thank you for coming in today. I hope you feel we met your needs.  Feel free to call [facility] if you have any questions or further requests.  Please consider signing up for MyChart if you do not already have it, as this is a great way to communicate with me.  Best,  Whitney McVey, PA-C   Food Choices for Gastroesophageal Reflux Disease, Adult When you have gastroesophageal reflux disease (GERD), the foods you eat and your eating habits are very important. Choosing the right foods can help ease your discomfort. What guidelines do I need to follow?  Choose fruits, vegetables, whole grains, and low-fat dairy products.  Choose low-fat meat, fish, and poultry.  Limit fats such as oils, salad dressings, butter, nuts, and avocado.  Keep a food  diary. This helps you identify foods that cause symptoms.  Avoid foods that cause symptoms. These may be different for everyone.  Eat small meals often instead of 3 large meals a day.  Eat your meals slowly, in a place where you are relaxed.  Limit fried foods.  Cook foods using methods other than frying.  Avoid drinking alcohol.  Avoid drinking large amounts of liquids with your meals.  Avoid bending over or lying down until 2-3 hours after eating. What foods are not recommended? These are some foods and drinks that may make your symptoms worse: Vegetables  Tomatoes. Tomato juice. Tomato and spaghetti sauce. Chili peppers. Onion and garlic. Horseradish. Fruits  Oranges, grapefruit, and lemon (fruit and juice). Meats  High-fat meats, fish, and poultry. This includes hot dogs, ribs, ham, sausage, salami, and bacon. Dairy  Whole milk and chocolate milk. Sour cream. Cream. Butter. Ice cream. Cream cheese. Drinks  Coffee and tea. Bubbly (carbonated) drinks or energy drinks. Condiments  Hot sauce. Barbecue sauce. Sweets/Desserts  Chocolate and cocoa. Donuts. Peppermint and spearmint. Fats and Oils  High-fat foods. This includes Fr[place]istanries and potato chips. Other  Vinegar. Strong spices. This includes black pepper, white pepper, red pepper, cayenne, curry powder, cloves, ginger, and chili powder. The items listed above may not be a complete list of foods and drinks to avoid. Contact your dietitian for more information.  This information is not intended to replace advice given to you  by your health care provider. Make sure you discuss any questions you have with your health care provider. Document Released: 10/26/2011 Document Revised: 10/02/2015 Document Reviewed: 02/28/2013 Elsevier Interactive Patient Education  2017 [employer].

## 2016-07-08 NOTE — Progress Notes (Signed)
Jeff Perry  MRN: [id number] DOB: 10/03/1965  PCP: No primary care provider on file.  Subjective:  Pt is a 51 year old male who presents to clinic for dry cough x 1 month. Non-productive. Is worse at night. Worse when he lays down. Endorses regurgitation feeling in his chest. +headache when he coughs.  He went to [place] 04/[place]. No new medications. No blood pressure medications.  He has tried OTC cough syrup - not helping.  Denies night sweats, fever, chills, chest pain, SOB, chest tightness, wheezing, hemoptysis.   Review of Systems  Constitutional: Negative for chills, diaphoresis and fever.  HENT: Positive for rhinorrhea. Negative for congestion, postnasal drip, sinus pain, sinus pressure, sore throat and trouble swallowing.   Respiratory: Positive for cough. Negative for chest tightness, shortness of breath and wheezing.   Cardiovascular: Negative for chest pain and palpitations.  Gastrointestinal: Negative for abdominal pain, diarrhea, nausea and vomiting.  Neurological: Negative for dizziness, syncope, light-headedness and headaches.  Psychiatric/Behavioral: Positive for sleep disturbance. The patient is not nervous/anxious.     There are no active problems to display for this patient.   No current outpatient prescriptions on file prior to visit.   No current facility-administered medications on file prior to visit.     No Known Allergies   Objective:  BP 110/78   Pulse 60   Temp 98.1 F (36.7 C) (Oral)   Resp 16   Ht 5\' 7"  (1.702 m)   Wt 178 lb (80.7 kg)   SpO2 98%   BMI 27.88 kg/m   Physical Exam  Constitutional: He is oriented to person, place, and time and well-developed, well-nourished, and in no distress. No distress.  HENT:  Right Ear: Tympanic membrane normal.  Left Ear: Tympanic membrane normal.  Mouth/Throat: Mucous membranes are normal. Posterior oropharyngeal edema present. No oropharyngeal exudate or posterior oropharyngeal erythema.    Cardiovascular: Normal rate, regular rhythm and normal heart sounds.   Pulmonary/Chest: Effort normal and breath sounds normal. He has no decreased breath sounds. He has no wheezes. He has no rhonchi. He has no rales.  Neurological: He is alert and oriented to person, place, and time. GCS score is 15.  Skin: Skin is warm and dry.  Psychiatric: Mood, memory, affect and judgment normal.  Vitals reviewed.   Results for orders placed or performed in visit on 07/08/16  POCT CBC  Result Value Ref Range   WBC 6.3 4.6 - 10.2 K/uL   Lymph, poc 2.8 0.6 - 3.4   POC LYMPH PERCENT 44.8 10 - 50 %L   MID (cbc) 0.2 0 - 0.9   POC MID % 3.9 0 - 12 %M   POC Granulocyte 3.2 2 - 6.9   Granulocyte percent 51.3 37 - 80 %G   RBC 4.70 4.69 - 6.13 M/uL   Hemoglobin 14.5 14.1 - 18.1 g/dL   HCT, POC 40.941.1 (A) 81.143.5 - 53.7 %   MCV 87.5 80 - 97 fL   MCH, POC 30.9 27 - 31.2 pg   MCHC 35.3 31.8 - 35.4 g/dL   RDW, POC 91.412.3 %   Platelet Count, POC 246 142 - 424 K/uL   MPV 8.4 0 - 99.8 fL   Dg Chest 2 View  Result Date: 07/08/2016 CLINICAL DATA:  Cough for 4 weeks EXAM: CHEST  2 VIEW COMPARISON:  12/03/2012 FINDINGS: The heart size and mediastinal contours are within normal limits. Both lungs are clear. The visualized skeletal structures are unremarkable. IMPRESSION: No active cardiopulmonary  disease. Electronically Signed   By: Alcide Clever M.D.   On: 07/08/2016 18:17    Assessment and Plan :  1. Cough 2. Nasal congestion 3. Gastroesophageal reflux disease, esophagitis presence not specified - POCT CBC - DG Chest 2 View; Future - amoxicillin-clavulanate (AUGMENTIN) 875-125 MG tablet; Take 1 tablet by mouth 2 (two) times daily.  Dispense: 20 tablet; Refill: 0 - fluticasone (FLONASE) 50 MCG/ACT nasal spray; Place 2 sprays into both nostrils daily.  Dispense: 16 g; Refill: 6 - Dextromethorphan-Guaifenesin (MUCINEX DM MAXIMUM STRENGTH) 60-1200 MG TB12; Take 1 tablet by mouth every 12 (twelve) hours.  Dispense: 14  each; Refill: 1 - pantoprazole (PROTONIX) 40 MG tablet; Take 1 tablet (40 mg total) by mouth daily.  Dispense: 30 tablet; Refill: 3 - famotidine (PEPCID) 20 MG tablet; Take 1 tablet (20 mg total) by mouth at bedtime.  Dispense: 30 tablet; Refill: 1 - Negative chest x-ray and labs are wnl. Suspect reflux as the source of his cough. Will treat for GERD. Discussed GERD prevention strategies, information printed off for pt. He is to [place] in 4-6 weeks for f/u.   Marco Collie, PA-C  Primary Care at [facility] 07/08/2016 5:23 PM

## 2016-08-02 ENCOUNTER — Ambulatory Visit: Payer: Self-pay | Admitting: Physician Assistant

## 2016-11-13 ENCOUNTER — Emergency Department (HOSPITAL_COMMUNITY)
Admission: EM | Admit: 2016-11-13 | Discharge: 2016-11-13 | Disposition: A | Discharge status: Home / Self Care | Payer: Self-pay | Attending: Emergency Medicine | Admitting: Emergency Medicine

## 2016-11-13 ENCOUNTER — Encounter (HOSPITAL_COMMUNITY): Payer: Self-pay

## 2016-11-13 DIAGNOSIS — L600 Ingrowing nail: Secondary | ICD-10-CM | POA: Insufficient documentation

## 2016-11-13 DIAGNOSIS — Z79899 Other long term (current) drug therapy: Secondary | ICD-10-CM | POA: Insufficient documentation

## 2016-11-13 NOTE — ED Provider Notes (Signed)
[facility]-EMERGENCY DEPT Provider Note   CSN: [id number] Arrival date & time: 11/13/16  1729   By signing my name below, I, Soijett Blue, attest that this documentation has been prepared under the direction and in the presence of Burna Forts, PA-C Electronically Signed: Soijett Blue, ED Scribe. 11/13/16. 6:59 PM.  History   Chief Complaint Chief Complaint  Patient presents with  . Toe Pain    HPI Jeff Perry is a 51 y.o. male who presents to the Emergency Department complaining of left great toe pain onset 2 weeks ago. Pt reports associated redness to left great toe and ingrown left great toenail. Pt has not tried any medications for the relief of his symptoms. He notes that he has a hx of similar symptoms to his right great toenail that was removed in the ED. He denies swelling and any other symptoms.   The history is provided by the patient. No language interpreter was used.    History reviewed. No pertinent past medical history.  There are no active problems to display for this patient.   History reviewed. No pertinent surgical history.     Home Medications    Prior to Admission medications   Medication Sig Start Date End Date Taking? Authorizing Provider  amoxicillin-clavulanate (AUGMENTIN) 875-125 MG tablet Take 1 tablet by mouth 2 (two) times daily. 07/08/16   McVey, Madelaine Bhat, PA-C  Dextromethorphan-Guaifenesin (MUCINEX DM MAXIMUM STRENGTH) 60-1200 MG TB12 Take 1 tablet by mouth every 12 (twelve) hours. 07/08/16   McVey, Madelaine Bhat, PA-C  famotidine (PEPCID) 20 MG tablet Take 1 tablet (20 mg total) by mouth at bedtime. 07/08/16   McVey, Madelaine Bhat, PA-C  fluticasone (FLONASE) 50 MCG/ACT nasal spray Place 2 sprays into both nostrils daily. 07/08/16   McVey, Madelaine Bhat, PA-C  pantoprazole (PROTONIX) 40 MG tablet Take 1 tablet (40 mg total) by mouth daily. 07/08/16   McVey, Madelaine Bhat, PA-C    Family History History reviewed. No pertinent family  history.  Social History Social History  Substance Use Topics  . Smoking status: Never Smoker  . Smokeless tobacco: Never Used  . Alcohol use No     Allergies   Patient has no known allergies.   Review of Systems Review of Systems  Musculoskeletal: Positive for arthralgias (left great toe). Negative for joint swelling.  Skin: Positive for color change (redness to left great toe). Negative for wound.       +ingrown toenail to left great toe  All other systems reviewed and are negative.    Physical Exam Updated Vital Signs BP 121/71 (BP Location: Right Arm)   Pulse 93   Temp 97.8 F (36.6 C) (Oral)   Resp 18   SpO2 100%   Physical Exam  Constitutional: He is oriented to person, place, and time. He appears well-developed and well-nourished. No distress.  HENT:  Head: Normocephalic and atraumatic.  Eyes: EOM are normal.  Neck: Neck supple.  Cardiovascular: Normal rate.   Pulmonary/Chest: Effort normal. No respiratory distress.  Abdominal: He exhibits no distension.  Musculoskeletal: Normal range of motion.  Left great toe with an ingrown toenail. No surrounding redness or discharge. No signs of infection.   Neurological: He is alert and oriented to person, place, and time.  Skin: Skin is warm and dry.  Psychiatric: He has a normal mood and affect. His behavior is normal.  Nursing note and vitals reviewed.    ED Treatments / Results  DIAGNOSTIC STUDIES: Oxygen Saturation is 100% on RA,  nl by my interpretation.    COORDINATION OF CARE: 6:57 PM Discussed treatment plan with pt at bedside and pt agreed to plan.   Labs (all labs ordered are listed, but only abnormal results are displayed) Labs Reviewed - No data to display  EKG  EKG Interpretation None       Radiology No results found.  Procedures Procedures (including critical care time)  Medications Ordered in ED Medications - No data to display   Initial Impression / Assessment and Plan / ED  Course  I have reviewed the triage vital signs and the nursing notes.  Pertinent labs & imaging results that were available during my care of the patient were reviewed by me and considered in my medical decision making (see chart for details).      Assessment/Plan:  Pt has an ingrown toenail. I discussed treatment options including removal here. Pt would like to follow up outpatient with Podiatry. No signs of infection. Symptomatic care instructions given. Given strict return precautions in the event new or worsening symptoms present. Pt verbalized understanding and agreement to today's plan and had no further questions or concerns at this time.   Final Clinical Impressions(s) / ED Diagnoses   Final diagnoses:  Ingrown left big toenail    New Prescriptions Discharge Medication List as of 11/13/2016  6:59 PM     I personally performed the services described in this documentation, which was scribed in my presence. The recorded information has been reviewed and is accurate.    Eyvonne MechanicHedges, Kimmie Doren, PA-C 11/13/16 Aretha Parrot1937    Arby BarrettePfeiffer, Marcy, MD 11/24/16 2008

## 2016-11-13 NOTE — ED Notes (Signed)
Refused D/C vitals and signature. Pt left after PA gave him his paperwork. He did not want to wait for anything else.

## 2016-11-13 NOTE — ED Triage Notes (Signed)
Pt with ?infection to left big toe x 3 months. Pain is worse in last few days.

## 2016-11-13 NOTE — Discharge Instructions (Signed)
Please read attached information. If you experience any new or worsening signs or symptoms please return to the emergency room for evaluation. Please follow-up with your primary care provider or specialist as discussed.  °

## 2016-11-17 ENCOUNTER — Ambulatory Visit (INDEPENDENT_AMBULATORY_CARE_PROVIDER_SITE_OTHER): Payer: Self-pay | Admitting: Podiatry

## 2016-11-17 DIAGNOSIS — L600 Ingrowing nail: Secondary | ICD-10-CM

## 2016-11-17 NOTE — Patient Instructions (Signed)
Epsom Salt Soak Instructions   Place 1/4 cup of epsom salts in a quart of warm tap water.  Soak your foot or feet in the solution for 20 minutes twice a day until you notice the area has dried and a scab has formed. Continue to apply other medications to the area as directed by the doctor such as polysporin, neosporin or cortisporin drops.  IF YOUR SKIN BECOMES IRRITATED WHILE USING THESE INSTRUCTIONS, IT IS OKAY TO SWITCH TO  WHITE VINEGAR AND WATER. Or you may use antibacterial soap and water to keep the toe clean  Monitor for any signs/symptoms of infection. Call the office immediately if any occur or go directly to the emergency room. Call with any questions/concerns. 

## 2016-11-28 NOTE — Progress Notes (Signed)
   Subjective: Patient presents today for evaluation of pain in toe(s). Patient is concerned for possible ingrown nail. Patient states that the pain has been present for a few weeks now. Patient presents today for further treatment and evaluation.  Objective:  General: Well developed, nourished, in no acute distress, alert and oriented x3   Dermatology: Skin is warm, dry and supple bilateral. Medial border of the left great toe appears to be erythematous with evidence of an ingrowing nail. Pain on palpation noted to the border of the nail fold. The remaining nails appear unremarkable at this time. There are no open sores, lesions.  Vascular: Dorsalis Pedis artery and Posterior Tibial artery pedal pulses palpable. No lower extremity edema noted.   Neruologic: Grossly intact via light touch bilateral.  Musculoskeletal: Muscular strength within normal limits in all groups bilateral. Normal range of motion noted to all pedal and ankle joints.   Assesement: #1 Paronychia with ingrowing nail medial border left great toe #2 Pain in toe #3 Incurvated nail  Plan of Care:  1. Patient evaluated.  2. Discussed treatment alternatives and plan of care. Explained nail avulsion procedure and post procedure course to patient. 3. Patient opted for permanent partial nail avulsion.  4. Prior to procedure, local anesthesia infiltration utilized using 3 ml of a 50:50 mixture of 2% plain lidocaine and 0.5% plain marcaine in a normal hallux block fashion and a betadine prep performed.  5. Partial permanent nail avulsion with chemical matrixectomy performed using 3x30sec applications of phenol followed by alcohol flush.  6. Light dressing applied. 7. Return to clinic in 2 weeks.   Alfred Eckley M. Malynn Lucy, DPM [facility]  Dr. Jonesha Tsuchiya M. Basilia Stuckert, DPM    [address]                                        [place], [place] [address]                Office [phone]  Fax [phone]      

## 2017-01-16 IMAGING — CT CT RENAL STONE PROTOCOL
2 of 3 series · 17 of 39 positions shown, 19 images · non-contrast
Comparison: 10/22/2003

CLINICAL DATA: Old left lower back lifting heavy object yesterday.
Left flank pain. Worse with movement.

EXAM:
CT ABDOMEN AND PELVIS WITHOUT CONTRAST
TECHNIQUE: Multidetector CT imaging of the abdomen and pelvis was performed
following the standard protocol without IV contrast.

[Series 4: lung · axial · 0.76mm/px · z∈[-368,-258]mm · 14 of 25 slices shown, 16 images]
[im 2/25  soft-tissue]
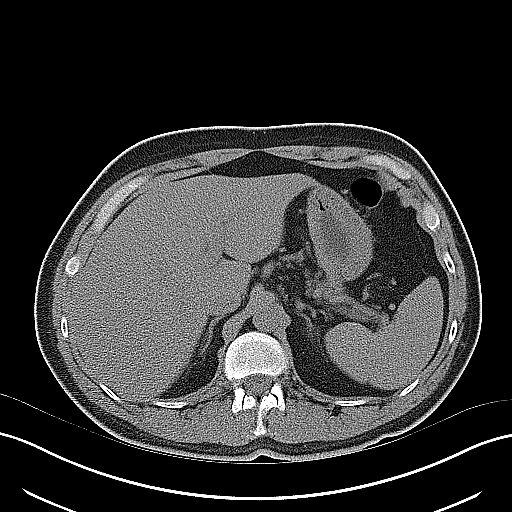
[im 2/25  bone]
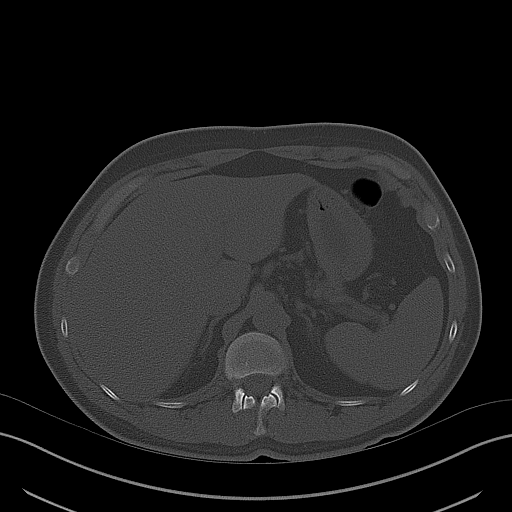
[im 4/25  soft-tissue]
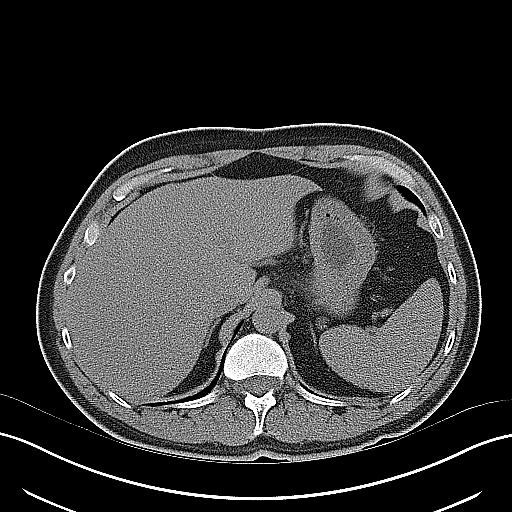
[im 6/25  soft-tissue]
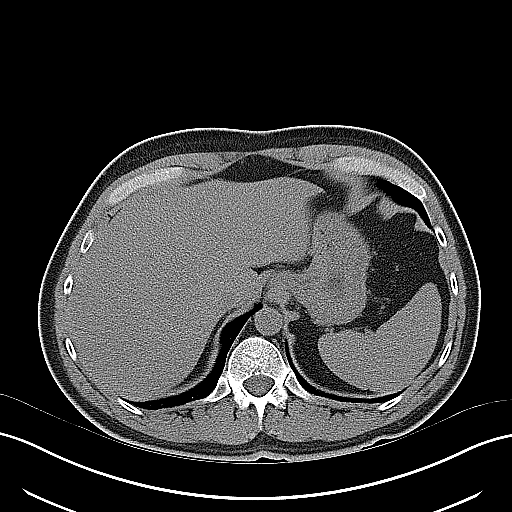
[im 7/25  soft-tissue]
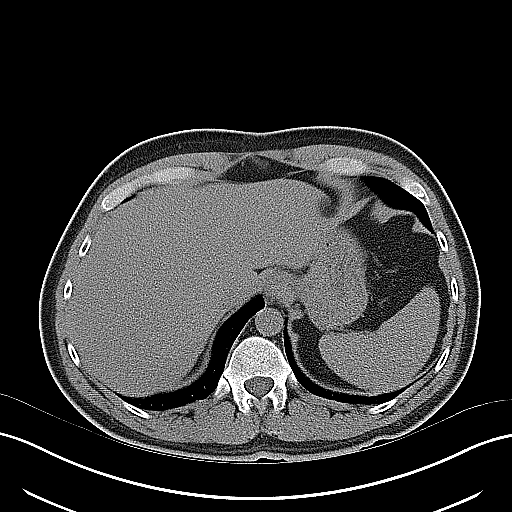
[im 9/25  soft-tissue]
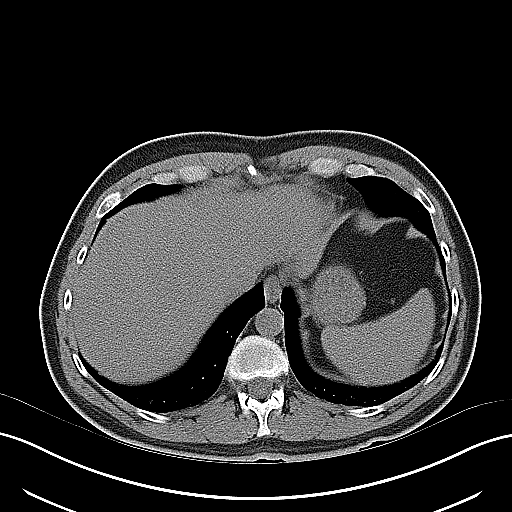
[im 11/25  soft-tissue]
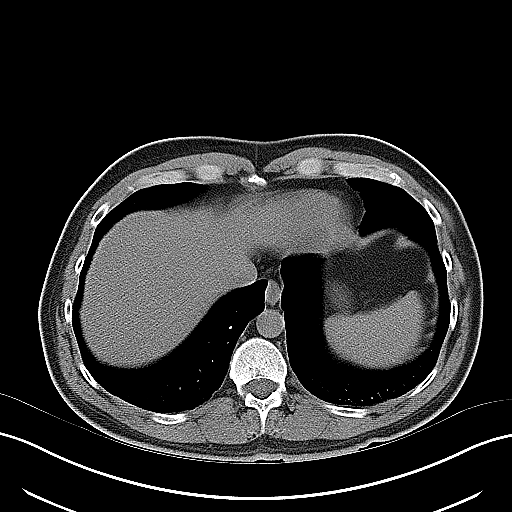
[im 12/25  soft-tissue]
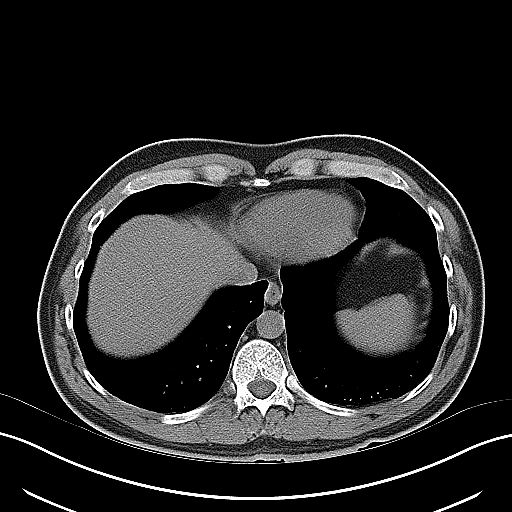
[im 14/25  soft-tissue]
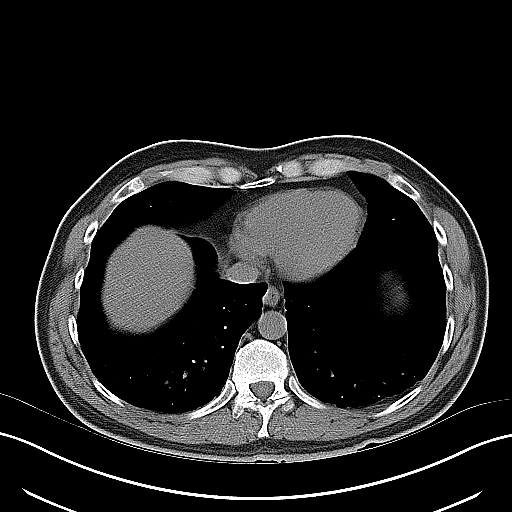
[im 15/25  soft-tissue]
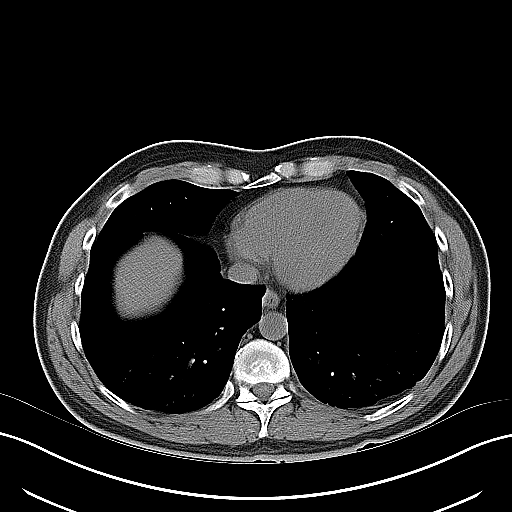
[im 15/25  bone]
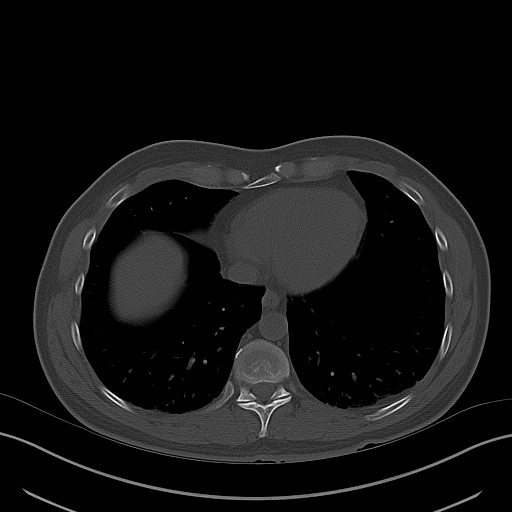
[im 17/25  soft-tissue]
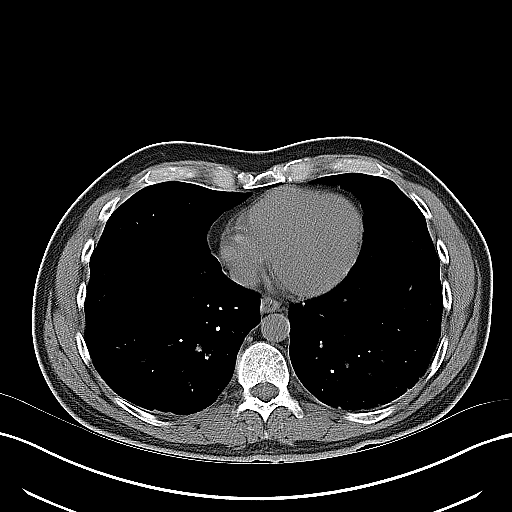
[im 19/25  soft-tissue]
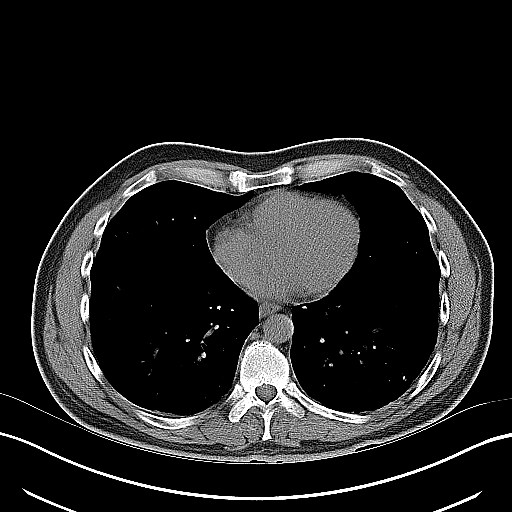
[im 20/25  soft-tissue]
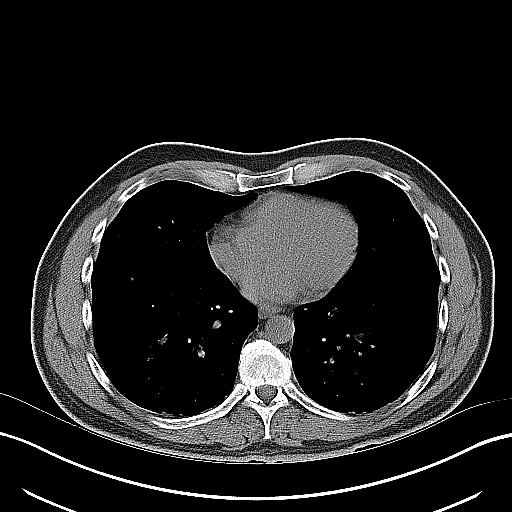
[im 22/25  soft-tissue]
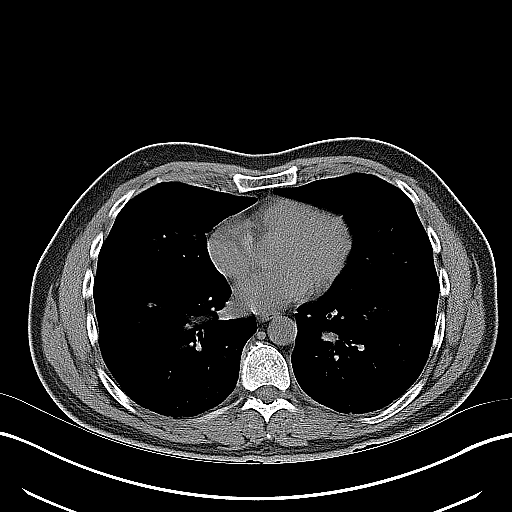
[im 24/25  soft-tissue]
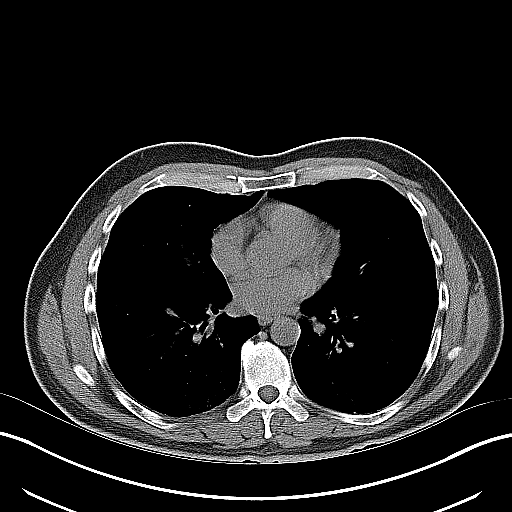

[Series 5: coronal · coronal · 0.74mm/px · 3 of 88 slices shown]
[im 30/88  soft-tissue]
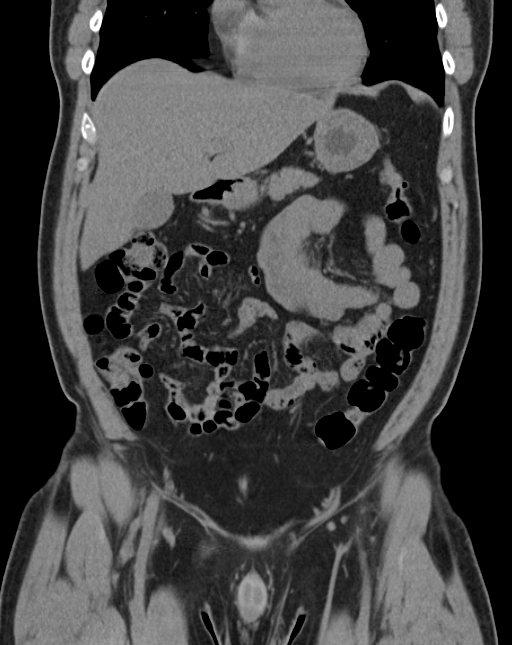
[im 39/88  soft-tissue]
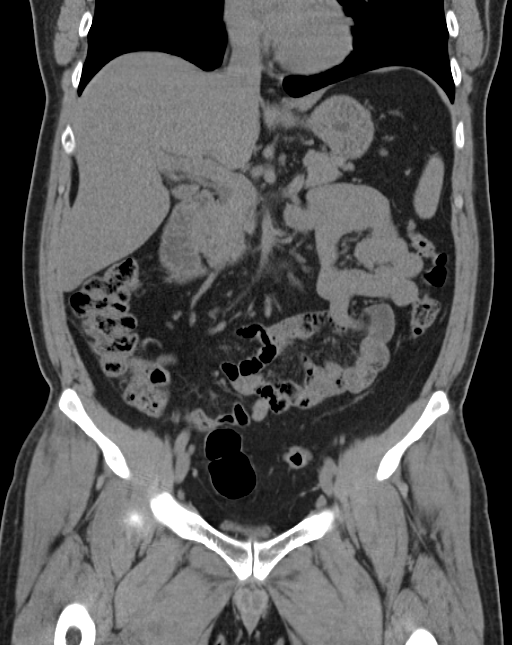
[im 49/88  soft-tissue]
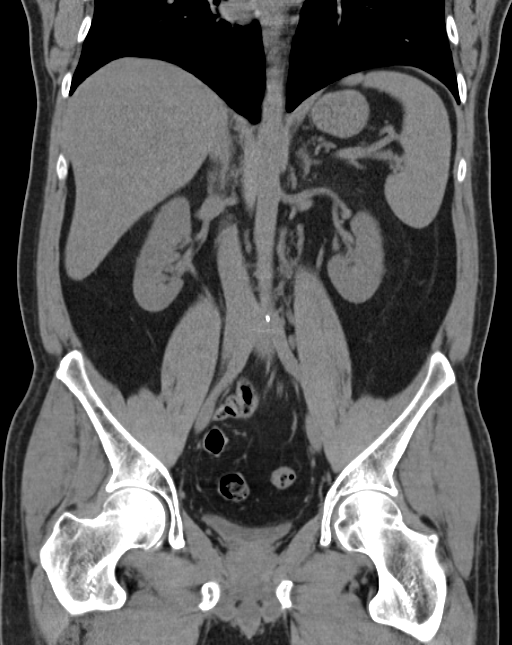

[17 of 39 positions shown; findings below may reference images not displayed]

FINDINGS: Minimal dependent atelectasis in the lung bases. Heart is normal
size. No effusions.

Liver, gallbladder, spleen, pancreas, adrenals and kidneys have an
unremarkable unenhanced appearance. No renal or ureteral stones. No
hydronephrosis. Aorta is normal caliber.

Bowel grossly unremarkable. No free fluid, free air, or adenopathy.
Appendix not definitively seen, but no inflammatory process in the
right lower quadrant. Urinary bladder is decompressed.

No acute bony abnormality or focal bone lesion.
IMPRESSION: No acute findings in the abdomen or pelvis on this unenhanced study.

## 2017-10-31 ENCOUNTER — Ambulatory Visit (INDEPENDENT_AMBULATORY_CARE_PROVIDER_SITE_OTHER): Payer: Self-pay | Admitting: Podiatry

## 2017-10-31 DIAGNOSIS — L600 Ingrowing nail: Secondary | ICD-10-CM

## 2017-10-31 NOTE — Patient Instructions (Signed)

## 2017-11-04 NOTE — Progress Notes (Signed)
   Subjective: Patient presents today for evaluation of pain to the medial border of the right hallux that began a few weeks ago. Patient is concerned for possible ingrown nail. Wearing shoes and applying pressure to the toe increases the pain. He has not done anything for treatment at home. Patient presents today for further treatment and evaluation.  No past medical history on file.  Objective:  General: Well developed, nourished, in no acute distress, alert and oriented x3   Dermatology: Skin is warm, dry and supple bilateral. Medial border of the right hallux appears to be erythematous with evidence of an ingrowing nail. Pain on palpation noted to the border of the nail fold. The remaining nails appear unremarkable at this time. There are no open sores, lesions.  Vascular: Dorsalis Pedis artery and Posterior Tibial artery pedal pulses palpable. No lower extremity edema noted.   Neruologic: Grossly intact via light touch bilateral.  Musculoskeletal: Muscular strength within normal limits in all groups bilateral. Normal range of motion noted to all pedal and ankle joints.   Assesement: #1 Paronychia with ingrowing nail medial border right hallux  #2 Pain in toe #3 Incurvated nail  Plan of Care:  1. Patient evaluated.  2. Discussed treatment alternatives and plan of care. Explained nail avulsion procedure and post procedure course to patient. 3. Patient opted for permanent partial nail avulsion.  4. Prior to procedure, local anesthesia infiltration utilized using 3 ml of a 50:50 mixture of 2% plain lidocaine and 0.5% plain marcaine in a normal hallux block fashion and a betadine prep performed.  5. Partial permanent nail avulsion with chemical matrixectomy performed using 3x30sec applications of phenol followed by alcohol flush.  6. Light dressing applied. 7. Return to clinic as needed.   Felecia ShellingBrent M. Madyx Delfin, DPM [facility]  Dr. Felecia ShellingBrent M. Preston Garabedian, DPM    [address]                                        [place], [place] [address]                Office [phone]  Fax [phone]

## 2019-08-06 ENCOUNTER — Ambulatory Visit: Payer: 59 | Attending: Internal Medicine

## 2019-08-06 DIAGNOSIS — Z23 Encounter for immunization: Secondary | ICD-10-CM

## 2019-08-06 NOTE — Progress Notes (Signed)
   Covid-19 Vaccination Clinic  Name:  Jeff Perry    MRN: [id number] DOB: Nov 27, 1965  08/06/2019  Jeff Perry was observed post Covid-19 immunization for 15 minutes without incident. He was provided with Vaccine Information Sheet and instruction to access the V-Safe system.   Jeff Perry was instructed to call 911 with any severe reactions post vaccine: Marland Kitchen Difficulty breathing  . Swelling of face and throat  . A fast heartbeat  . A bad rash all over body  . Dizziness and weakness   Immunizations Administered    Name Date Dose VIS Date Route   Pfizer COVID-19 Vaccine 08/06/2019 10:36 AM 0.3 mL 04/20/2019 Intramuscular   Manufacturer: [employer], [employer]   Lot: [id number]   NDC: [address]-2

## 2019-08-29 ENCOUNTER — Ambulatory Visit: Payer: 59 | Attending: Internal Medicine

## 2019-08-29 DIAGNOSIS — Z23 Encounter for immunization: Secondary | ICD-10-CM

## 2019-08-29 NOTE — Progress Notes (Signed)
   Covid-19 Vaccination Clinic  Name:  Jeff Perry    MRN: [id number] DOB: 04-11-1966  08/29/2019  [name] was observed post Covid-19 immunization for 15 minutes without incident. He was provided with Vaccine Information Sheet and instruction to access the V-Safe system.   [name] was instructed to call 911 with any severe reactions post vaccine: Marland Kitchen Difficulty breathing  . Swelling of face and throat  . A fast heartbeat  . A bad rash all over body  . Dizziness and weakness   Immunizations Administered    Name Date Dose VIS Date Route   Pfizer COVID-19 Vaccine 08/29/2019 11:02 AM 0.3 mL 07/04/2018 Intramuscular   Manufacturer: [employer], [employer]   Lot: [id number]   NDC: [address]-3

## 2021-06-13 ENCOUNTER — Encounter (HOSPITAL_BASED_OUTPATIENT_CLINIC_OR_DEPARTMENT_OTHER): Payer: Self-pay

## 2021-06-13 ENCOUNTER — Emergency Department (HOSPITAL_BASED_OUTPATIENT_CLINIC_OR_DEPARTMENT_OTHER)
Admission: EM | Admit: 2021-06-13 | Discharge: 2021-06-13 | Disposition: A | Discharge status: Home / Self Care | Payer: 59 | Attending: Emergency Medicine | Admitting: Emergency Medicine

## 2021-06-13 ENCOUNTER — Other Ambulatory Visit: Payer: Self-pay

## 2021-06-13 DIAGNOSIS — S0101XA Laceration without foreign body of scalp, initial encounter: Secondary | ICD-10-CM | POA: Insufficient documentation

## 2021-06-13 DIAGNOSIS — W208XXA Other cause of strike by thrown, projected or falling object, initial encounter: Secondary | ICD-10-CM | POA: Insufficient documentation

## 2021-06-13 DIAGNOSIS — Z23 Encounter for immunization: Secondary | ICD-10-CM | POA: Insufficient documentation

## 2021-06-13 DIAGNOSIS — S0990XA Unspecified injury of head, initial encounter: Secondary | ICD-10-CM

## 2021-06-13 MED ORDER — TETANUS-DIPHTH-ACELL PERTUSSIS 5-2.5-18.5 LF-MCG/0.5 IM SUSY
0.5000 mL | PREFILLED_SYRINGE | Freq: Once | INTRAMUSCULAR | Status: AC
  Administered 2021-06-13: 0.5 mL via INTRAMUSCULAR
  Filled 2021-06-13: qty 0.5

## 2021-06-13 MED ORDER — LIDOCAINE-EPINEPHRINE-TETRACAINE (LET) TOPICAL GEL
3.0000 mL | Freq: Once | TOPICAL | Status: AC
  Administered 2021-06-13: 3 mL via TOPICAL
  Filled 2021-06-13: qty 3

## 2021-06-13 NOTE — ED Provider Notes (Signed)
[facility] EMERGENCY DEPARTMENT Provider Note   CSN: [id number] Arrival date & time: 06/13/21  1312     History  Chief Complaint  Patient presents with   Laceration    Head lac from car hood striking the top of his head    Jeff Perry is a 56 y.o. male.  Patient is a 56 year old male who presents with a head laceration.  He was working on a car and the hood fell and struck the top of his head.  This happened about an hour prior to arrival.  He had no loss of consciousness.  He does not have any headache other than rate in the area where he hit his head.  No worsening headaches.  No nausea or vomiting.  No dizziness.  His tetanus shot is not up-to-date.  He denies any neck or back pain.  No other injuries.      Home Medications Prior to Admission medications   Medication Sig Start Date End Date Taking? Authorizing Provider  amoxicillin-clavulanate (AUGMENTIN) 875-125 MG tablet Take 1 tablet by mouth 2 (two) times daily. 07/08/16   McVey, Madelaine Bhat, PA-C  Dextromethorphan-Guaifenesin (MUCINEX DM MAXIMUM STRENGTH) 60-1200 MG TB12 Take 1 tablet by mouth every 12 (twelve) hours. 07/08/16   McVey, Madelaine Bhat, PA-C  famotidine (PEPCID) 20 MG tablet Take 1 tablet (20 mg total) by mouth at bedtime. 07/08/16   McVey, Madelaine Bhat, PA-C  fluticasone (FLONASE) 50 MCG/ACT nasal spray Place 2 sprays into both nostrils daily. 07/08/16   McVey, Madelaine Bhat, PA-C  pantoprazole (PROTONIX) 40 MG tablet Take 1 tablet (40 mg total) by mouth daily. 07/08/16   McVey, Madelaine Bhat, PA-C      Allergies    Patient has no known allergies.    Review of Systems   Review of Systems  Constitutional:  Negative for chills, diaphoresis, fatigue and fever.  HENT:  Negative for nosebleeds.   Eyes: Negative.   Respiratory:  Negative for shortness of breath.   Cardiovascular:  Negative for chest pain.  Gastrointestinal:  Negative for abdominal pain, nausea and vomiting.   Genitourinary:  Negative for difficulty urinating.  Musculoskeletal:  Negative for arthralgias and back pain.  Skin:  Positive for wound. Negative for rash.  Neurological:  Negative for dizziness, speech difficulty, weakness, numbness and headaches.   Physical Exam Updated Vital Signs BP (!) 138/100 (BP Location: Right Arm)    Pulse 79    Temp 98.1 F (36.7 C) (Oral)    Resp 20    SpO2 99%  Physical Exam Constitutional:      Appearance: He is well-developed.  HENT:     Head: Normocephalic.     Comments: 2 cm laceration to the parietal scalp area.  No active bleeding.  There is small amount of swelling around the area but no significant hematoma.  No bony tenderness  Eyes:     Pupils: Pupils are equal, round, and reactive to light.  Neck:     Comments: No pain along the spine Cardiovascular:     Rate and Rhythm: Normal rate and regular rhythm.     Heart sounds: Normal heart sounds.  Pulmonary:     Effort: Pulmonary effort is normal. No respiratory distress.     Breath sounds: Normal breath sounds. No wheezing or rales.  Chest:     Chest wall: No tenderness.  Abdominal:     General: Bowel sounds are normal.     Palpations: Abdomen is soft.  Tenderness: There is no abdominal tenderness. There is no guarding or rebound.  Musculoskeletal:        General: Normal range of motion.     Cervical back: Normal range of motion and neck supple.  Lymphadenopathy:     Cervical: No cervical adenopathy.  Skin:    General: Skin is warm and dry.     Findings: No rash.  Neurological:     Mental Status: He is alert and oriented to person, place, and time.    ED Results / Procedures / Treatments   Labs (all labs ordered are listed, but only abnormal results are displayed) Labs Reviewed - No data to display  EKG None  Radiology No results found.  Procedures .Marland KitchenLaceration Repair  Date/Time: 06/13/2021 2:48 PM Performed by: Rolan Bucco, MD Authorized by: Rolan Bucco, MD    Consent:    Consent obtained:  Verbal   Consent given by:  Patient   Risks, benefits, and alternatives were discussed: yes     Risks discussed:  Infection, pain, poor cosmetic result and poor wound healing   Alternatives discussed:  No treatment Universal protocol:    Patient identity confirmed:  Verbally with patient Anesthesia:    Anesthesia method:  Topical application   Topical anesthetic:  LET Laceration details:    Location:  Scalp   Scalp location:  L parietal   Length (cm):  2 Pre-procedure details:    Preparation:  Patient was prepped and draped in usual sterile fashion Exploration:    Hemostasis achieved with:  LET   Imaging outcome: foreign body not noted     Wound exploration: entire depth of wound visualized     Wound extent: no fascia violation noted, no foreign bodies/material noted and no vascular damage noted     Contaminated: no   Treatment:    Area cleansed with:  Saline   Amount of cleaning:  Standard   Irrigation solution:  Sterile saline   Irrigation method:  Syringe   Visualized foreign bodies/material removed: no   Skin repair:    Repair method:  Tissue adhesive Approximation:    Approximation:  Close Repair type:    Repair type:  Simple Post-procedure details:    Dressing:  Open (no dressing)   Procedure completion:  Tolerated well, no immediate complications    Medications Ordered in ED Medications  lidocaine-EPINEPHrine-tetracaine (LET) topical gel (3 mLs Topical Given 06/13/21 1340)  Tdap (BOOSTRIX) injection 0.5 mL (0.5 mLs Intramuscular Given 06/13/21 1351)    ED Course/ Medical Decision Making/ A&P                           Medical Decision Making Risk Prescription drug management.   Patient is a 56 year old male who presents with a laceration to his scalp.  There is no active bleeding.  The edges are well approximated.  Appears appropriate for glue.  He does not have any other headache, vomiting, dizziness or other indications for  head CT.  The wound was fixed with Dermabond.  This achieved good wound approximation.  Patient was given wound care instructions and head injury precautions.  His tetanus shot was updated as well.  Final Clinical Impression(s) / ED Diagnoses Final diagnoses:  Laceration of scalp, initial encounter  Injury of head, initial encounter    Rx / DC Orders ED Discharge Orders     None         Rolan Bucco, MD 06/13/21 1450

## 2021-06-13 NOTE — ED Triage Notes (Signed)
Arabic is primary language.  He and his friend were working on a car and the hood fell striking [place] on the left upper side of his head.   No loss of consciousness.   Gait steady.

## 2022-02-14 ENCOUNTER — Emergency Department (HOSPITAL_COMMUNITY): Payer: 59

## 2022-02-14 ENCOUNTER — Other Ambulatory Visit: Payer: Self-pay

## 2022-02-14 ENCOUNTER — Encounter (HOSPITAL_COMMUNITY): Payer: Self-pay

## 2022-02-14 ENCOUNTER — Emergency Department (HOSPITAL_COMMUNITY)
Admission: EM | Admit: 2022-02-14 | Discharge: 2022-02-14 | Disposition: A | Discharge status: Home / Self Care | Payer: 59 | Attending: Emergency Medicine | Admitting: Emergency Medicine

## 2022-02-14 DIAGNOSIS — G43009 Migraine without aura, not intractable, without status migrainosus: Secondary | ICD-10-CM | POA: Diagnosis not present

## 2022-02-14 DIAGNOSIS — R519 Headache, unspecified: Secondary | ICD-10-CM | POA: Diagnosis present

## 2022-02-14 LAB — BASIC METABOLIC PANEL
Anion gap: 9 (ref 5–15)
BUN: 23 mg/dL — ABNORMAL HIGH (ref 6–20)
CO2: 25 mmol/L (ref 22–32)
Calcium: 9.5 mg/dL (ref 8.9–10.3)
Chloride: 104 mmol/L (ref 98–111)
Creatinine, Ser: 0.78 mg/dL (ref 0.61–1.24)
GFR, Estimated: >60 mL/min (ref >60)
Glucose, Bld: 106 mg/dL — ABNORMAL HIGH (ref 70–99)
Potassium: 4.2 mmol/L (ref 3.5–5.1)
Sodium: 138 mmol/L (ref 135–145)

## 2022-02-14 LAB — CBC WITH DIFFERENTIAL/PLATELET
Abs Immature Granulocytes: 0.01 10*3/uL (ref 0.00–0.07)
Basophils Absolute: 0 10*3/uL (ref 0.0–0.1)
Basophils Relative: 1 %
Eosinophils Absolute: 0.2 10*3/uL (ref 0.0–0.5)
Eosinophils Relative: 3 %
HCT: 47 % (ref 39.0–52.0)
Hemoglobin: 15.5 g/dL (ref 13.0–17.0)
Immature Granulocytes: 0 %
Lymphocytes Relative: 49 %
Lymphs Abs: 2.6 10*3/uL (ref 0.7–4.0)
MCH: 29.5 pg (ref 26.0–34.0)
MCHC: 33 g/dL (ref 30.0–36.0)
MCV: 89.4 fL (ref 80.0–100.0)
Monocytes Absolute: 0.4 10*3/uL (ref 0.1–1.0)
Monocytes Relative: 7 %
Neutro Abs: 2.1 10*3/uL (ref 1.7–7.7)
Neutrophils Relative %: 40 %
Platelets: 238 10*3/uL (ref 150–400)
RBC: 5.26 MIL/uL (ref 4.22–5.81)
RDW: 12.5 % (ref 11.5–15.5)
WBC: 5.2 10*3/uL (ref 4.0–10.5)
nRBC: 0 % (ref 0.0–0.2)

## 2022-02-14 MED ORDER — SODIUM CHLORIDE 0.9 % IV BOLUS
1000.0000 mL | Freq: Once | INTRAVENOUS | Status: AC
  Administered 2022-02-14: 1000 mL via INTRAVENOUS

## 2022-02-14 MED ORDER — KETOROLAC TROMETHAMINE 15 MG/ML IJ SOLN
15.0000 mg | Freq: Once | INTRAMUSCULAR | Status: AC
  Administered 2022-02-14: 15 mg via INTRAVENOUS
  Filled 2022-02-14: qty 1

## 2022-02-14 NOTE — ED Provider Notes (Signed)
[facility] DEPT Provider Note   CSN: [id number] Arrival date & time: 02/14/22  0947     History  Chief Complaint  Patient presents with   Headache    Jeff Perry is a 56 y.o. male who presents to the emergency department with concerns for bilateral headache onset 3 months.  He notes that his headache has not been constant however its been intermittent over the past 3 months.  Has associated phonophobia.  No meds tried for his symptoms at home.  Was evaluated at urgent care for his symptoms on 01/28/2019.  Given Augmentin with relief of his cough that he initially had.  Denies blurry vision, diplopia, numbness, tingling, weakness, photophobia, nausea, vomiting.   Per patient chart review: Patient was evaluated in urgent care on 01/27/2022 for similar symptoms.  At that time was sent a prescription for Augmentin.  The history is provided by the patient. No language interpreter was used.       Home Medications Prior to Admission medications   Medication Sig Start Date End Date Taking? Authorizing Provider  amoxicillin-clavulanate (AUGMENTIN) 875-125 MG tablet Take 1 tablet by mouth 2 (two) times daily. 07/08/16   McVey, Gelene Mink, PA-C  Dextromethorphan-Guaifenesin (MUCINEX DM MAXIMUM STRENGTH) 60-1200 MG TB12 Take 1 tablet by mouth every 12 (twelve) hours. 07/08/16   McVey, Gelene Mink, PA-C  famotidine (PEPCID) 20 MG tablet Take 1 tablet (20 mg total) by mouth at bedtime. 07/08/16   McVey, Gelene Mink, PA-C  fluticasone (FLONASE) 50 MCG/ACT nasal spray Place 2 sprays into both nostrils daily. 07/08/16   McVey, Gelene Mink, PA-C  pantoprazole (PROTONIX) 40 MG tablet Take 1 tablet (40 mg total) by mouth daily. 07/08/16   McVey, Gelene Mink, PA-C      Allergies    Patient has no known allergies.    Review of Systems   Review of Systems  Eyes:  Negative for photophobia and visual disturbance.  Gastrointestinal:  Negative for  nausea and vomiting.  Neurological:  Positive for headaches. Negative for weakness and numbness.       -Tingling  All other systems reviewed and are negative.   Physical Exam Updated Vital Signs BP 134/84 (BP Location: Left Arm)   Pulse 61   Temp 97.8 F (36.6 C) (Oral)   Resp 18   Ht 5\' 7"  (1.702 m)   Wt 80 kg   SpO2 100%   BMI 27.62 kg/m  Physical Exam Vitals and nursing note reviewed.  Constitutional:      General: He is not in acute distress.    Appearance: He is not diaphoretic.  HENT:     Head: Normocephalic and atraumatic.     Comments: No tenderness to palpation noted to frontal or maxillary sinuses.    Mouth/Throat:     Pharynx: No oropharyngeal exudate.  Eyes:     General: No scleral icterus.    Conjunctiva/sclera: Conjunctivae normal.  Cardiovascular:     Rate and Rhythm: Normal rate and regular rhythm.     Pulses: Normal pulses.     Heart sounds: Normal heart sounds.  Pulmonary:     Effort: Pulmonary effort is normal. No respiratory distress.     Breath sounds: Normal breath sounds. No wheezing.  Abdominal:     General: Bowel sounds are normal.     Palpations: Abdomen is soft. There is no mass.     Tenderness: There is no abdominal tenderness. There is no guarding or rebound.  Musculoskeletal:  General: Normal range of motion.     Cervical back: Normal range of motion and neck supple.  Skin:    General: Skin is warm and dry.  Neurological:     General: No focal deficit present.     Mental Status: He is alert.     Cranial Nerves: Cranial nerves 2-12 are intact.     Sensory: Sensation is intact.     Motor: Motor function is intact. No pronator drift.     Comments: Grip strength 5/5 bilaterally.  Strength sensation intact to bilateral upper and lower extremities.  Negative pronator drift.  No focal neurologic deficit on exam.  Able to ambulate without assistance or difficulty.  Psychiatric:        Behavior: Behavior normal.     ED Results /  Procedures / Treatments   Labs (all labs ordered are listed, but only abnormal results are displayed) Labs Reviewed  BASIC METABOLIC PANEL - Abnormal; Notable for the following components:      Result Value   Glucose, Bld 106 (*)    BUN 23 (*)    All other components within normal limits  CBC WITH DIFFERENTIAL/PLATELET    EKG None  Radiology CT Head Wo Contrast  Result Date: 02/14/2022 CLINICAL DATA:  Headache, stroke suspected EXAM: CT HEAD WITHOUT CONTRAST TECHNIQUE: Contiguous axial images were obtained from the base of the skull through the vertex without intravenous contrast. RADIATION DOSE REDUCTION: This exam was performed according to the departmental dose-optimization program which includes automated exposure control, adjustment of the mA and/or kV according to patient size and/or use of iterative reconstruction technique. COMPARISON:  Head CT June 03, 2011 FINDINGS: Brain: No evidence of acute infarction, hemorrhage, hydrocephalus, extra-axial collection or mass lesion/mass effect. Vascular: No hyperdense vessel or unexpected calcification. Skull: Normal. Negative for fracture or focal lesion. Sinuses/Orbits: No acute finding. Other: None. IMPRESSION: No acute intracranial abnormality. Electronically Signed   By: Beryle Flock M.D.   On: 02/14/2022 10:42    Procedures Procedures    Medications Ordered in ED Medications  ketorolac (TORADOL) 15 MG/ML injection 15 mg (15 mg Intravenous Given 02/14/22 1152)  sodium chloride 0.9 % bolus 1,000 mL (0 mLs Intravenous Stopped 02/14/22 1311)    ED Course/ Medical Decision Making/ A&P Clinical Course as of 02/14/22 1315  Sun Feb 14, 2022  1311 Notified by RN that patient feeling better and requesting to leave the emergency department.  Patient reevaluated and noted improvement of symptoms. [SB]  1314 Patient reevaluated and sitting on the side of the stretcher.  Noted improvement of symptoms with treatment regimen in the ED.   Discussed discharge treatment plan.  Answered all available questions.  Patient appears safe for discharge at this time. [SB]    Clinical Course User Index [SB] Marielena Harvell A, PA-C                           Medical Decision Making Risk Prescription drug management.   Pt presented to the ED with intermittent 6/10 headache onset 3 months. No vision changes, nausea, vomiting, photophobia, numbness, tingling, weakness. Has phonophobia. Vital signs pt afebrile. On exam, pt with grip strength 5/5 bilaterally.  Strength sensation intact to bilateral upper and lower extremities.  Negative pronator drift.  No focal neurologic deficit on exam.  Able to ambulate without assistance or difficulty. No tenderness to palpation noted to frontal or maxillary sinuses. Differential diagnosis includes SAH, ICH, migraine, tension headache.  Additional history obtained:  External records from outside source obtained and reviewed including: Patient was evaluated in urgent care on 01/27/2022 for similar symptoms.  At that time was sent a prescription for Augmentin.  Labs:  I ordered, and personally interpreted labs.  The pertinent results include:   CBC unremarkable. BMP with slightly elevated BUN otherwise unremarkable  Imaging: I ordered imaging studies including CT head without I independently visualized and interpreted imaging which showed: No acute intracranial findings I agree with the radiologist interpretation  Medications:  I ordered medication including IV fluids, Toradol for pain management  Reevaluation of the patient after these medicines and interventions, I reevaluated the patient and found that they have improved I have reviewed the patients home medicines and have made adjustments as needed   Disposition: Presentation suspicious for migraine headache. Presentation less likely due to [facility] or ICH due to the absence of red flags. After consideration of the diagnostic results and the patients  response to treatment, I feel that the patient would benefit from Discharge home. Discussed with patient they may follow-up with their primary care provider as needed.  Supportive care measures and strict return precautions discussed with patient.  Patient knowledges and verbalized understanding.  Patient agreeable to discharge treatment plan. Patient appears safe for discharge at this time.  Follow-up as indicated in the discharge paperwork.   This chart was dictated using voice recognition software, Dragon. Despite the best efforts of this provider to proofread and correct errors, errors may still occur which can change documentation meaning.  Final Clinical Impression(s) / ED Diagnoses Final diagnoses:  Migraine without aura and without status migrainosus, not intractable    Rx / DC Orders ED Discharge Orders     None         Anetta Olvera A, PA-C 02/14/22 1315    Hayden Rasmussen, MD 02/14/22 1749

## 2022-02-14 NOTE — Discharge Instructions (Addendum)
It was a pleasure taking care of you today!   Your labs today were unremarkable. You may take over the counter 600 mg ibuprofen every 6 hours as needed and alternate with 500 mg Tylenol every 6 hours as needed for no more than 7 days.  You may follow-up with your primary care provider regarding today's ED visit.  If you do not have a primary care provider, you may follow-up with the [facility].  Return to the emergency department for experiencing increasing/worsening symptoms.

## 2022-02-14 NOTE — ED Triage Notes (Signed)
Pt reports headache x3 months that is worse with cough and bearing down.  Treated with Augmentin for sinus infection on 9/20 with no improvement per patient.

## 2022-02-14 NOTE — ED Notes (Signed)
Patient refused BP blood pressure check at discharge.

## 2022-02-14 NOTE — ED Provider Triage Note (Signed)
Emergency Medicine Provider Triage Evaluation Note  Jeff Perry , a 56 y.o. male  was evaluated in triage.  Pt complains of headache for 3 months.  He reports that at first he had a headache and cough and was seen in urgent care and he was given a medication that made the cough go away but he continues to have a headache.  Says its bilateral.  Says he has had blurred vision in the past.  Review of Systems  Positive: Headache, blurred vision Negative: Weakness, head trauma  Physical Exam  BP 134/84 (BP Location: Left Arm)   Pulse 61   Temp 97.8 F (36.6 C) (Oral)   Resp 18   SpO2 100%  Gen:   Awake, no distress   Resp:  Normal effort  MSK:   Moves extremities without difficulty  Other:  Moving all extremities, 5 out of 5 strength in bilateral upper extremities.  PERRLA, no facial droop.  Alert and oriented and following commands  Medical Decision Making  Medically screening exam initiated at 10:05 AM.  Appropriate orders placed.  Jeff Perry was informed that the remainder of the evaluation will be completed by another provider, this initial triage assessment does not replace that evaluation, and the importance of remaining in the ED until their evaluation is complete.     Rhae Hammock, PA-C 02/14/22 1006

## 2022-10-31 ENCOUNTER — Emergency Department (HOSPITAL_COMMUNITY): Payer: 59

## 2022-10-31 ENCOUNTER — Encounter (HOSPITAL_COMMUNITY): Payer: Self-pay | Admitting: Emergency Medicine

## 2022-10-31 ENCOUNTER — Emergency Department (HOSPITAL_COMMUNITY)
Admission: EM | Admit: 2022-10-31 | Discharge: 2022-10-31 | Disposition: A | Discharge status: Home / Self Care | Payer: 59 | Attending: Emergency Medicine | Admitting: Emergency Medicine

## 2022-10-31 ENCOUNTER — Other Ambulatory Visit: Payer: Self-pay

## 2022-10-31 DIAGNOSIS — H538 Other visual disturbances: Secondary | ICD-10-CM | POA: Diagnosis not present

## 2022-10-31 DIAGNOSIS — Y92410 Unspecified street and highway as the place of occurrence of the external cause: Secondary | ICD-10-CM | POA: Diagnosis not present

## 2022-10-31 DIAGNOSIS — M545 Low back pain, unspecified: Secondary | ICD-10-CM | POA: Diagnosis present

## 2022-10-31 MED ORDER — IBUPROFEN 600 MG PO TABS: 600.0000 mg | ORAL_TABLET | Freq: Four times a day (QID) | ORAL | 0 refills | Status: AC | PRN

## 2022-10-31 MED ORDER — CYCLOBENZAPRINE HCL 10 MG PO TABS: 10.0000 mg | ORAL_TABLET | Freq: Two times a day (BID) | ORAL | 0 refills | Status: AC | PRN

## 2022-10-31 MED ORDER — LIDOCAINE 5 % EX PTCH
1.0000 | MEDICATED_PATCH | CUTANEOUS | Status: DC
  Administered 2022-10-31: 1 via TRANSDERMAL
  Filled 2022-10-31: qty 1

## 2022-10-31 MED ORDER — KETOROLAC TROMETHAMINE 30 MG/ML IJ SOLN
30.0000 mg | Freq: Once | INTRAMUSCULAR | Status: AC
  Administered 2022-10-31: 30 mg via INTRAMUSCULAR
  Filled 2022-10-31: qty 1

## 2022-10-31 MED ORDER — CYCLOBENZAPRINE HCL 10 MG PO TABS
5.0000 mg | ORAL_TABLET | Freq: Once | ORAL | Status: AC
  Administered 2022-10-31: 5 mg via ORAL
  Filled 2022-10-31: qty 1

## 2022-10-31 MED ORDER — LIDOCAINE 5 % EX PTCH: 1.0000 | MEDICATED_PATCH | CUTANEOUS | 0 refills | Status: AC

## 2022-10-31 NOTE — Discharge Instructions (Addendum)
Note the workup today was overall reassuring.  Imaging of the head did not show signs of bleed or other abnormality.  Imaging of your back did not show signs of fracture or dislocation.  I suspect your back pain is most likely secondary from a muscular injury.  Will treat at home with anti-inflammatories, muscle relaxer and numbing patches.  The muscle laxer can cause drowsiness so please do not take while driving.  Will provide work note.  Recommend follow-up with primary care for reassessment of your symptoms.  Please not hesitate to return to emergency department for worrisome signs and symptoms we discussed become apparent.
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
## 2023-05-30 ENCOUNTER — Emergency Department (HOSPITAL_COMMUNITY)
Admission: EM | Admit: 2023-05-30 | Discharge: 2023-05-30 | Disposition: A | Discharge status: Home / Self Care | Payer: 59 | Attending: Emergency Medicine | Admitting: Emergency Medicine

## 2023-05-30 ENCOUNTER — Other Ambulatory Visit: Payer: Self-pay

## 2023-05-30 DIAGNOSIS — H5789 Other specified disorders of eye and adnexa: Secondary | ICD-10-CM | POA: Diagnosis present

## 2023-05-30 DIAGNOSIS — H16133 Photokeratitis, bilateral: Secondary | ICD-10-CM | POA: Diagnosis not present

## 2023-05-30 MED ORDER — ERYTHROMYCIN 5 MG/GM OP OINT
TOPICAL_OINTMENT | Freq: Once | OPHTHALMIC | Status: AC
  Filled 2023-05-30: qty 3.5

## 2023-05-30 MED ORDER — FLUORESCEIN SODIUM 1 MG OP STRP
1.0000 | ORAL_STRIP | Freq: Once | OPHTHALMIC | Status: AC
  Administered 2023-05-30: 1 via OPHTHALMIC
  Filled 2023-05-30: qty 1

## 2023-05-30 MED ORDER — TETRACAINE HCL 0.5 % OP SOLN
1.0000 [drp] | Freq: Once | OPHTHALMIC | Status: AC
  Administered 2023-05-30: 1 [drp] via OPHTHALMIC
  Filled 2023-05-30: qty 4
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
## 2023-05-30 NOTE — ED Provider Notes (Signed)
[facility] AT [facility] Provider Note   CSN: [id number] Arrival date & time: 05/30/23  0747     History  Chief Complaint  Patient presents with   Eye Problem    Jeff Perry is a 58 y.o. male.  Patient is a 58 year old male with no significant medical history presenting today with complaint of severe pain and redness to bilateral eyes.  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  He felt like it was related to all the smoke that got into his eyes.  He reports last night the pain was so bad he was even unable to sleep.  They have been watering constantly.  He does complain of some mild blurry vision out of the left eye.  He does not wear contacts or glasses.  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  The history is provided by the patient.  Eye Problem      Home Medications Prior to Admission medications   Medication Sig Start Date End Date Taking? Authorizing Provider  amoxicillin-clavulanate (AUGMENTIN) 875-125 MG tablet Take 1 tablet by mouth 2 (two) times daily. 07/08/16   McVey, Madelaine Bhat, PA-C  cyclobenzaprine (FLEXERIL) 10 MG tablet Take 1 tablet (10 mg total) by mouth 2 (two) times daily as needed for muscle spasms. 10/31/22   Peter Garter, PA  Dextromethorphan-Guaifenesin (MUCINEX DM MAXIMUM STRENGTH) 60-1200 MG TB12 Take 1 tablet by mouth every 12 (twelve) hours. 07/08/16   McVey, Madelaine Bhat, PA-C  famotidine (PEPCID) 20 MG tablet Take 1 tablet (20 mg total) by mouth at bedtime. 07/08/16   McVey, Madelaine Bhat, PA-C  fluticasone (FLONASE) 50 MCG/ACT nasal spray Place 2 sprays into both nostrils daily. 07/08/16   McVey, Madelaine Bhat, PA-C  ibuprofen (ADVIL) 600 MG tablet Take 1 tablet (600 mg total) by mouth every 6 (six) hours as needed. 10/31/22   Sherian Maroon A, PA  lidocaine (LIDODERM) 5 % Place 1 patch onto the skin  daily. Remove & Discard patch within 12 hours or as directed by MD 10/31/22   Peter Garter, PA  pantoprazole (PROTONIX) 40 MG tablet Take 1 tablet (40 mg total) by mouth daily. 07/08/16   McVey, Madelaine Bhat, PA-C      Allergies    Patient has no known allergies.    Review of Systems   Review of Systems  Physical Exam Updated Vital Signs BP (!) 147/104 (BP Location: Right Arm)   Pulse 75   Temp (!) 97.5 F (36.4 C) (Oral)   Resp 18   Ht 5\' 7"  (1.702 m)   Wt 72.6 kg   SpO2 100%   BMI 25.06 kg/m  Physical Exam Vitals and nursing note reviewed.  HENT:     Head: Normocephalic.  Eyes:     General:        Right eye: No foreign body.        Left eye: No foreign body.     Extraocular Movements: Extraocular movements intact.     Conjunctiva/sclera:     Right eye: Right conjunctiva is injected. No exudate or hemorrhage.    Left eye: Left conjunctiva is injected. No exudate or hemorrhage.    Pupils: Pupils are equal, round, and reactive to light.     Right eye: Fluorescein uptake present. No corneal abrasion.     Left eye: Fluorescein uptake present. No  corneal abrasion.     Slit lamp exam:    Right eye: Corneal flare and photophobia present. No corneal ulcer, hyphema or hypopyon.     Left eye: Corneal flare and photophobia present. No corneal ulcer, hyphema or hypopyon.     Comments: Bilateral eyelids and skin in the periorbital area is erythematous consistent with a burn.  Diffuse minimal fluorescein uptake bilaterally.  Right eye is 20/20 and left eye is 20/40  Cardiovascular:     Rate and Rhythm: Normal rate.  Pulmonary:     Effort: Pulmonary effort is normal.  Neurological:     Mental Status: He is alert. Mental status is at baseline.     ED Results / Procedures / Treatments   Labs (all labs ordered are listed, but only abnormal results are displayed) Labs Reviewed - No data to display  EKG None  Radiology No results found.  Procedures Procedures     Medications Ordered in ED Medications  erythromycin ophthalmic ointment (has no administration in time range)  tetracaine (PONTOCAINE) 0.5 % ophthalmic solution 1 drop (1 drop Both Eyes Given 05/30/23 0816)  fluorescein ophthalmic strip 1 strip (1 strip Both Eyes Given 05/30/23 0816)    ED Course/ Medical Decision Making/ A&P                                 Medical Decision Making Risk Prescription drug management.   Patient presenting today with symptoms most classic for Welders keratitis.  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  He has diffuse injection of bilateral eyes and mild burn to the skin around the eyes.  Vision is intact but mildly decreased on the left 20/40.  No evidence of hypopyon or hyphema.  Patient is not a contact lens user.  Pain almost completely resolved after tetracaine.  Patient given lubricating ointment, to use cold compresses and NSAIDs as needed for pain.  Patient was given ophthalmology follow-up as needed if symptoms do not resolve.  Stressed to the patient to stay away from welding and in the future if he welds to use a helmet        Final Clinical Impression(s) / ED Diagnoses Final diagnoses:  Welders' keratitis of both eyes    Rx / DC Orders ED Discharge Orders     None         [name], MD 05/30/23 [phone]

## 2023-05-30 NOTE — ED Notes (Signed)
Visual Acuity: Right eye 2020, left eye 2040, both eyes 2020.
